# Patient Record
Sex: Male | Born: 1960 | Race: White | Hispanic: No | Marital: Single | Smoking: Never smoker
Health system: Southern US, Community
[De-identification: ages and names within clinical notes are randomized; demographics above are authoritative.]

## PROBLEM LIST (undated history)

## (undated) DIAGNOSIS — F0393 Unspecified dementia, unspecified severity, with mood disturbance: Secondary | ICD-10-CM

## (undated) DIAGNOSIS — F0281 Dementia in other diseases classified elsewhere with behavioral disturbance: Secondary | ICD-10-CM

## (undated) DIAGNOSIS — U071 COVID-19: Secondary | ICD-10-CM

## (undated) DIAGNOSIS — F339 Major depressive disorder, recurrent, unspecified: Secondary | ICD-10-CM

## (undated) DIAGNOSIS — I959 Hypotension, unspecified: Secondary | ICD-10-CM

## (undated) DIAGNOSIS — G20A1 Parkinson's disease without dyskinesia, without mention of fluctuations: Secondary | ICD-10-CM

## (undated) DIAGNOSIS — R41841 Cognitive communication deficit: Secondary | ICD-10-CM

## (undated) DIAGNOSIS — F329 Major depressive disorder, single episode, unspecified: Secondary | ICD-10-CM

## (undated) DIAGNOSIS — J9601 Acute respiratory failure with hypoxia: Secondary | ICD-10-CM

## (undated) DIAGNOSIS — F0280 Dementia in other diseases classified elsewhere without behavioral disturbance: Secondary | ICD-10-CM

## (undated) DIAGNOSIS — R262 Difficulty in walking, not elsewhere classified: Secondary | ICD-10-CM

## (undated) DIAGNOSIS — Z741 Need for assistance with personal care: Secondary | ICD-10-CM

## (undated) DIAGNOSIS — F02818 Dementia in other diseases classified elsewhere, unspecified severity, with other behavioral disturbance: Secondary | ICD-10-CM

## (undated) DIAGNOSIS — G4089 Other seizures: Secondary | ICD-10-CM

## (undated) DIAGNOSIS — G20 Parkinson's disease: Secondary | ICD-10-CM

## (undated) DIAGNOSIS — G301 Alzheimer's disease with late onset: Secondary | ICD-10-CM

## (undated) DIAGNOSIS — F0391 Unspecified dementia with behavioral disturbance: Secondary | ICD-10-CM

## (undated) DIAGNOSIS — G4700 Insomnia, unspecified: Secondary | ICD-10-CM

## (undated) DIAGNOSIS — F32A Depression, unspecified: Secondary | ICD-10-CM

## (undated) DIAGNOSIS — K5900 Constipation, unspecified: Secondary | ICD-10-CM

## (undated) DIAGNOSIS — G9341 Metabolic encephalopathy: Secondary | ICD-10-CM

## (undated) DIAGNOSIS — F0390 Unspecified dementia without behavioral disturbance: Secondary | ICD-10-CM

## (undated) DIAGNOSIS — R488 Other symbolic dysfunctions: Secondary | ICD-10-CM

## (undated) DIAGNOSIS — F03918 Unspecified dementia, unspecified severity, with other behavioral disturbance: Secondary | ICD-10-CM

## (undated) DIAGNOSIS — M6281 Muscle weakness (generalized): Secondary | ICD-10-CM

## (undated) DIAGNOSIS — R296 Repeated falls: Secondary | ICD-10-CM

## (undated) DIAGNOSIS — R569 Unspecified convulsions: Secondary | ICD-10-CM

---
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
## 2019-03-08 ENCOUNTER — Other Ambulatory Visit: Payer: Self-pay

## 2019-03-08 ENCOUNTER — Emergency Department (HOSPITAL_COMMUNITY): Payer: Medicare Other

## 2019-03-08 ENCOUNTER — Emergency Department (HOSPITAL_COMMUNITY)
Admission: EM | Admit: 2019-03-08 | Discharge: 2019-03-09 | Disposition: A | Discharge status: Home / Self Care | Payer: Medicare Other | Attending: Emergency Medicine | Admitting: Emergency Medicine

## 2019-03-08 ENCOUNTER — Encounter (HOSPITAL_COMMUNITY): Payer: Self-pay | Admitting: *Deleted

## 2019-03-08 DIAGNOSIS — G3183 Dementia with Lewy bodies: Secondary | ICD-10-CM | POA: Insufficient documentation

## 2019-03-08 DIAGNOSIS — Z79899 Other long term (current) drug therapy: Secondary | ICD-10-CM | POA: Insufficient documentation

## 2019-03-08 DIAGNOSIS — F0150 Vascular dementia without behavioral disturbance: Secondary | ICD-10-CM | POA: Diagnosis not present

## 2019-03-08 DIAGNOSIS — G301 Alzheimer's disease with late onset: Secondary | ICD-10-CM | POA: Diagnosis not present

## 2019-03-08 DIAGNOSIS — R4182 Altered mental status, unspecified: Secondary | ICD-10-CM | POA: Diagnosis present

## 2019-03-08 DIAGNOSIS — E860 Dehydration: Secondary | ICD-10-CM | POA: Insufficient documentation

## 2019-03-08 HISTORY — DX: Depression, unspecified: F32.A

## 2019-03-08 HISTORY — DX: Other seizures: G40.89

## 2019-03-08 HISTORY — DX: Unspecified dementia with behavioral disturbance: F03.91

## 2019-03-08 HISTORY — DX: Unspecified dementia without behavioral disturbance: F03.90

## 2019-03-08 HISTORY — DX: Difficulty in walking, not elsewhere classified: R26.2

## 2019-03-08 HISTORY — DX: Dementia in other diseases classified elsewhere with behavioral disturbance: F02.81

## 2019-03-08 HISTORY — DX: Other symbolic dysfunctions: R48.8

## 2019-03-08 HISTORY — DX: Metabolic encephalopathy: G93.41

## 2019-03-08 HISTORY — DX: Need for assistance with personal care: Z74.1

## 2019-03-08 HISTORY — DX: Dementia in other diseases classified elsewhere, unspecified severity, without behavioral disturbance, psychotic disturbance, mood disturbance, and anxiety: F02.80

## 2019-03-08 HISTORY — DX: Major depressive disorder, recurrent, unspecified: F33.9

## 2019-03-08 HISTORY — DX: Alzheimer's disease with late onset: G30.1

## 2019-03-08 HISTORY — DX: Unspecified dementia, unspecified severity, with other behavioral disturbance: F03.918

## 2019-03-08 HISTORY — DX: Parkinson's disease: G20

## 2019-03-08 HISTORY — DX: Insomnia, unspecified: G47.00

## 2019-03-08 HISTORY — DX: Dementia in other diseases classified elsewhere, unspecified severity, with other behavioral disturbance: F02.818

## 2019-03-08 HISTORY — DX: COVID-19: U07.1

## 2019-03-08 HISTORY — DX: Acute respiratory failure with hypoxia: J96.01

## 2019-03-08 HISTORY — DX: Parkinson's disease without dyskinesia, without mention of fluctuations: G20.A1

## 2019-03-08 HISTORY — DX: Muscle weakness (generalized): M62.81

## 2019-03-08 HISTORY — DX: Unspecified convulsions: R56.9

## 2019-03-08 HISTORY — DX: Cognitive communication deficit: R41.841

## 2019-03-08 HISTORY — DX: Repeated falls: R29.6

## 2019-03-08 HISTORY — DX: Constipation, unspecified: K59.00

## 2019-03-08 HISTORY — DX: Hypotension, unspecified: I95.9

## 2019-03-08 HISTORY — DX: Unspecified dementia, unspecified severity, with mood disturbance: F03.93

## 2019-03-08 LAB — COMPREHENSIVE METABOLIC PANEL
ALT: 49 U/L — ABNORMAL HIGH (ref 0–44)
AST: 57 U/L — ABNORMAL HIGH (ref 15–41)
Albumin: 3.3 g/dL — ABNORMAL LOW (ref 3.5–5.0)
Alkaline Phosphatase: 32 U/L — ABNORMAL LOW (ref 38–126)
Anion gap: 11 (ref 5–15)
BUN: 23 mg/dL — ABNORMAL HIGH (ref 6–20)
CO2: 27 mmol/L (ref 22–32)
Calcium: 8.6 mg/dL — ABNORMAL LOW (ref 8.9–10.3)
Chloride: 103 mmol/L (ref 98–111)
Creatinine, Ser: 1.03 mg/dL (ref 0.61–1.24)
GFR calc Af Amer: >60 mL/min (ref >60)
GFR calc non Af Amer: >60 mL/min (ref >60)
Glucose, Bld: 119 mg/dL — ABNORMAL HIGH (ref 70–99)
Potassium: 4.2 mmol/L (ref 3.5–5.1)
Sodium: 141 mmol/L (ref 135–145)
Total Bilirubin: 0.8 mg/dL (ref 0.3–1.2)
Total Protein: 6.8 g/dL (ref 6.5–8.1)

## 2019-03-08 LAB — CBC WITH DIFFERENTIAL/PLATELET
Abs Immature Granulocytes: 0.02 10*3/uL (ref 0.00–0.07)
Basophils Absolute: 0.1 10*3/uL (ref 0.0–0.1)
Basophils Relative: 1 %
Eosinophils Absolute: 0 10*3/uL (ref 0.0–0.5)
Eosinophils Relative: 0 %
HCT: 32 % — ABNORMAL LOW (ref 39.0–52.0)
Hemoglobin: 10 g/dL — ABNORMAL LOW (ref 13.0–17.0)
Immature Granulocytes: 0 %
Lymphocytes Relative: 11 %
Lymphs Abs: 0.9 10*3/uL (ref 0.7–4.0)
MCH: 31.3 pg (ref 26.0–34.0)
MCHC: 31.3 g/dL (ref 30.0–36.0)
MCV: 100 fL (ref 80.0–100.0)
Monocytes Absolute: 0.9 10*3/uL (ref 0.1–1.0)
Monocytes Relative: 11 %
Neutro Abs: 5.8 10*3/uL (ref 1.7–7.7)
Neutrophils Relative %: 77 %
Platelets: 167 10*3/uL (ref 150–400)
RBC: 3.2 MIL/uL — ABNORMAL LOW (ref 4.22–5.81)
RDW: 13 % (ref 11.5–15.5)
WBC: 7.6 10*3/uL (ref 4.0–10.5)
nRBC: 0 % (ref 0.0–0.2)

## 2019-03-08 MED ORDER — SODIUM CHLORIDE 0.9 % IV BOLUS
1000.0000 mL | Freq: Once | INTRAVENOUS | Status: AC
  Administered 2019-03-08: 23:00:00 1000 mL via INTRAVENOUS

## 2019-03-08 NOTE — Discharge Instructions (Addendum)
Drink more fluids and follow-up with your family doctor this week

## 2019-03-08 NOTE — ED Triage Notes (Signed)
Pt brought in by [facility] for c/o altered loc and weakness; pt is non-verbal but staff reports pt can eat with assistance and pt has not been eating for the last 3-4 days; pt tested positive for covid January 19th; pt screams out with movement

## 2019-03-09 NOTE — ED Provider Notes (Signed)
[facility] EMERGENCY DEPARTMENT Provider Note   CSN: [id number] Arrival date & time: 03/08/19  1938     History Chief Complaint  Patient presents with  . Altered Mental Status    Ian Travis. is a 59 y.o. male.  Patient stated nursing home.  He is nonverbal normally.  Nursing home staff says he is eating and drinking less.  The history is provided by a caregiver.  Altered Mental Status Presenting symptoms: behavior changes   Severity:  Mild Most recent episode:  Today Episode history:  Multiple Timing:  Constant Progression:  Worsening Chronicity:  New      Past Medical History:  Diagnosis Date  . Acute respiratory failure with hypoxia (HCC)   . Aphasia-angular gyrus syndrome   . Cannot walk   . CN (constipation)   . Cognitive communication deficit   . Dementia of the Alzheimer's type, with late onset, with delusions (HCC)   . Dementia with behavioral disturbance (HCC)   . Dementia with Lewy bodies (HCC)   . Generalized-onset seizures (HCC)   . Hypotension, unspecified   . Metabolic encephalopathy   . Muscle weakness (generalized)   . Parkinsons disease (HCC)   . Persistent disorder of initiating or maintaining sleep   . Personal care impairment   . Pneumonia due to 2019-nCoV   . Presenile dementia with depressive features (HCC)   . Recurrent major depression (HCC)   . Repeated falls   . Somatosensory attacks (HCC)     There are no problems to display for this patient.   History reviewed. No pertinent surgical history.     History reviewed. No pertinent family history.  Social History   Tobacco Use  . Smoking status: Not on file  . Smokeless tobacco: Never Used  Substance Use Topics  . Alcohol use: Not Currently  . Drug use: Not Currently    Home Medications Prior to Admission medications   Medication Sig Start Date End Date Taking? Authorizing Provider  Amino Acids-Protein Hydrolys (PRO-STAT PO) Take 30 mLs by mouth 2 (two) times  daily.   Yes [provider]  ascorbic acid (VITAMIN C) 500 MG tablet Take 500 mg by mouth 2 (two) times daily.   Yes [provider]  fludrocortisone (FLORINEF) 0.1 MG tablet Take 0.1 mg by mouth daily.   Yes [provider]  midodrine (PROAMATINE) 10 MG tablet Take 10 mg by mouth 3 (three) times daily.   Yes [provider]  Multiple Vitamin (MULTIVITAMIN WITH MINERALS) TABS tablet Take 1 tablet by mouth daily.   Yes [provider]  risperiDONE (RISPERDAL) 0.5 MG tablet Take 0.5 mg by mouth 2 (two) times daily.   Yes [provider]  sertraline (ZOLOFT) 100 MG tablet Take 100 mg by mouth daily.   Yes [provider]  Valproate Sodium (DEPAKENE) 250 MG/5ML SOLN solution Take 500 mg by mouth 2 (two) times daily.   Yes [provider]    Allergies    Patient has no known allergies.  Review of Systems   Review of Systems  Unable to perform ROS: Mental status change    Physical Exam Updated Vital Signs BP 130/66   Pulse 95   Temp 98.7 F (37.1 C) (Oral)   Resp 15   SpO2 99%   Physical Exam Vitals and nursing note reviewed.  Constitutional:      Appearance: He is well-developed.  HENT:     Head: Normocephalic.     Mouth/Throat:  Comments: Right mucous membranes. Eyes:     General: No scleral icterus.    Conjunctiva/sclera: Conjunctivae normal.  Neck:     Thyroid: No thyromegaly.  Cardiovascular:     Rate and Rhythm: Normal rate and regular rhythm.     Heart sounds: No murmur. No friction rub. No gallop.   Pulmonary:     Breath sounds: No stridor. No wheezing or rales.  Chest:     Chest wall: No tenderness.  Abdominal:     General: There is no distension.     Tenderness: There is no abdominal tenderness. There is no rebound.  Musculoskeletal:        General: Normal range of motion.     Cervical back: Neck supple.  Lymphadenopathy:     Cervical: No cervical adenopathy.  Skin:    Findings: No  erythema or rash.  Neurological:     Mental Status: He is alert.     Motor: No abnormal muscle tone.     Coordination: Coordination normal.     Comments: Patient is nonverbal     ED Results / Procedures / Treatments   Labs (all labs ordered are listed, but only abnormal results are displayed) Labs Reviewed  COMPREHENSIVE METABOLIC PANEL - Abnormal; Notable for the following components:      Result Value   Glucose, Bld 119 (*)    BUN 23 (*)    Calcium 8.6 (*)    Albumin 3.3 (*)    AST 57 (*)    ALT 49 (*)    Alkaline Phosphatase 32 (*)    All other components within normal limits  CBC WITH DIFFERENTIAL/PLATELET - Abnormal; Notable for the following components:   RBC 3.20 (*)    Hemoglobin 10.0 (*)    HCT 32.0 (*)    All other components within normal limits    EKG EKG Interpretation  Date/Time:  Monday March 08 2019 19:49:01 EST Ventricular Rate:  88 PR Interval:    QRS Duration: 79 QT Interval:  352 QTC Calculation: 426 R Axis:   69 Text Interpretation: Sinus rhythm No previous ECGs available Confirmed by Theotis Burrow [phone]) on 03/09/2019 9:19:43 AM   Radiology CT Head Wo Contrast  Result Date: 03/08/2019 CLINICAL DATA:  Ataxia, stroke suspected. Additional provided: Altered mental status, weakness, patient nonverbal at baseline but has not been eating for the past 3-4 days EXAM: CT HEAD WITHOUT CONTRAST TECHNIQUE: Contiguous axial images were obtained from the base of the skull through the vertex without intravenous contrast. COMPARISON:  Brain MRI 11/15/2015 FINDINGS: Brain: Streak artifact from dental restoration somewhat limits evaluation of the posterior fossa. No evidence of acute intracranial hemorrhage. No demarcated cortical infarction. No evidence of intracranial mass. No midline shift or extra-axial fluid collection. Moderate generalized cerebral atrophy has significantly progressed since MRI 11/19/2015 with associated interval increase in lateral and third  ventriculomegaly. Vascular: No hyperdense vessel. Skull: Normal. Negative for fracture or focal lesion. Sinuses/Orbits: Visualized orbits demonstrate no acute abnormality. Mild ethmoid sinus mucosal thickening. Small amount of frothy secretions within posterior right ethmoid air cells. Small bilateral maxillary sinus mucous retention cysts. No significant mastoid effusion. IMPRESSION: Streak artifact from dental restoration somewhat limits evaluation of the posterior fossa. Within this limitation, no evidence of acute intracranial abnormality. Moderate generalized cerebral atrophy has significantly progressed since MRI 11/19/2015. Paranasal sinus disease as described. This includes frothy secretions within posterior right ethmoid air cells. Correlate for acute on chronic sinusitis. Electronically Signed   By: Kellie Simmering  DO   On: 03/08/2019 21:59    Procedures Procedures (including critical care time)  Medications Ordered in ED Medications  sodium chloride 0.9 % bolus 1,000 mL (0 mLs Intravenous Stopped 03/09/19 0040)    ED Course  I have reviewed the triage vital signs and the nursing notes.  Pertinent labs & imaging results that were available during my care of the patient were reviewed by me and considered in my medical decision making (see chart for details).    MDM Rules/Calculators/A&P                      Labs suggest dehydration.  Patient given fluids and seemed to be back to his baseline.  He does have severe dementia.  He will be sent back to the nursing home Final Clinical Impression(s) / ED Diagnoses Final diagnoses:  Dehydration  Vascular dementia without behavioral disturbance [facility])    Rx / DC Orders ED Discharge Orders    None       Bethann Berkshire, MD 03/09/19 2308

## 2019-03-23 ENCOUNTER — Other Ambulatory Visit: Payer: Self-pay

## 2019-03-23 ENCOUNTER — Inpatient Hospital Stay (HOSPITAL_COMMUNITY)
Admission: EM | Admit: 2019-03-23 | Discharge: 2019-03-26 | DRG: 871 | Disposition: A | Discharge status: Skilled Nursing Facility | Payer: Medicare Other | Attending: Internal Medicine | Admitting: Internal Medicine

## 2019-03-23 ENCOUNTER — Encounter (HOSPITAL_COMMUNITY): Payer: Self-pay | Admitting: Emergency Medicine

## 2019-03-23 ENCOUNTER — Emergency Department (HOSPITAL_COMMUNITY): Payer: Medicare Other

## 2019-03-23 DIAGNOSIS — Z515 Encounter for palliative care: Secondary | ICD-10-CM

## 2019-03-23 DIAGNOSIS — Z681 Body mass index (BMI) 19 or less, adult: Secondary | ICD-10-CM | POA: Diagnosis not present

## 2019-03-23 DIAGNOSIS — F0280 Dementia in other diseases classified elsewhere without behavioral disturbance: Secondary | ICD-10-CM | POA: Diagnosis present

## 2019-03-23 DIAGNOSIS — G301 Alzheimer's disease with late onset: Secondary | ICD-10-CM | POA: Diagnosis present

## 2019-03-23 DIAGNOSIS — I9589 Other hypotension: Secondary | ICD-10-CM | POA: Diagnosis present

## 2019-03-23 DIAGNOSIS — G9341 Metabolic encephalopathy: Secondary | ICD-10-CM | POA: Diagnosis present

## 2019-03-23 DIAGNOSIS — R000 Tachycardia, unspecified: Secondary | ICD-10-CM | POA: Diagnosis present

## 2019-03-23 DIAGNOSIS — J9601 Acute respiratory failure with hypoxia: Secondary | ICD-10-CM | POA: Diagnosis present

## 2019-03-23 DIAGNOSIS — Z8616 Personal history of COVID-19: Secondary | ICD-10-CM | POA: Diagnosis present

## 2019-03-23 DIAGNOSIS — R569 Unspecified convulsions: Secondary | ICD-10-CM | POA: Diagnosis not present

## 2019-03-23 DIAGNOSIS — G40909 Epilepsy, unspecified, not intractable, without status epilepticus: Secondary | ICD-10-CM | POA: Diagnosis present

## 2019-03-23 DIAGNOSIS — Y95 Nosocomial condition: Secondary | ICD-10-CM | POA: Diagnosis present

## 2019-03-23 DIAGNOSIS — I959 Hypotension, unspecified: Secondary | ICD-10-CM | POA: Diagnosis present

## 2019-03-23 DIAGNOSIS — L89102 Pressure ulcer of unspecified part of back, stage 2: Secondary | ICD-10-CM | POA: Diagnosis present

## 2019-03-23 DIAGNOSIS — K5900 Constipation, unspecified: Secondary | ICD-10-CM | POA: Diagnosis present

## 2019-03-23 DIAGNOSIS — E860 Dehydration: Secondary | ICD-10-CM | POA: Diagnosis present

## 2019-03-23 DIAGNOSIS — R627 Adult failure to thrive: Secondary | ICD-10-CM | POA: Diagnosis present

## 2019-03-23 DIAGNOSIS — G3183 Dementia with Lewy bodies: Secondary | ICD-10-CM | POA: Diagnosis present

## 2019-03-23 DIAGNOSIS — N179 Acute kidney failure, unspecified: Secondary | ICD-10-CM | POA: Diagnosis present

## 2019-03-23 DIAGNOSIS — J189 Pneumonia, unspecified organism: Secondary | ICD-10-CM | POA: Diagnosis present

## 2019-03-23 DIAGNOSIS — A419 Sepsis, unspecified organism: Principal | ICD-10-CM | POA: Diagnosis present

## 2019-03-23 DIAGNOSIS — R262 Difficulty in walking, not elsewhere classified: Secondary | ICD-10-CM | POA: Diagnosis present

## 2019-03-23 DIAGNOSIS — E872 Acidosis, unspecified: Secondary | ICD-10-CM | POA: Diagnosis present

## 2019-03-23 DIAGNOSIS — Z7401 Bed confinement status: Secondary | ICD-10-CM

## 2019-03-23 DIAGNOSIS — R488 Other symbolic dysfunctions: Secondary | ICD-10-CM | POA: Diagnosis not present

## 2019-03-23 DIAGNOSIS — F0281 Dementia in other diseases classified elsewhere with behavioral disturbance: Secondary | ICD-10-CM | POA: Diagnosis present

## 2019-03-23 DIAGNOSIS — Z8701 Personal history of pneumonia (recurrent): Secondary | ICD-10-CM

## 2019-03-23 DIAGNOSIS — G20A1 Parkinson's disease without dyskinesia, without mention of fluctuations: Secondary | ICD-10-CM | POA: Diagnosis present

## 2019-03-23 DIAGNOSIS — R41841 Cognitive communication deficit: Secondary | ICD-10-CM

## 2019-03-23 DIAGNOSIS — R1310 Dysphagia, unspecified: Secondary | ICD-10-CM | POA: Diagnosis present

## 2019-03-23 DIAGNOSIS — E46 Unspecified protein-calorie malnutrition: Secondary | ICD-10-CM | POA: Diagnosis present

## 2019-03-23 DIAGNOSIS — R6520 Severe sepsis without septic shock: Secondary | ICD-10-CM | POA: Diagnosis not present

## 2019-03-23 DIAGNOSIS — Z7189 Other specified counseling: Secondary | ICD-10-CM | POA: Diagnosis not present

## 2019-03-23 DIAGNOSIS — R4701 Aphasia: Secondary | ICD-10-CM | POA: Diagnosis present

## 2019-03-23 DIAGNOSIS — Z66 Do not resuscitate: Secondary | ICD-10-CM | POA: Diagnosis present

## 2019-03-23 DIAGNOSIS — D72829 Elevated white blood cell count, unspecified: Secondary | ICD-10-CM | POA: Diagnosis present

## 2019-03-23 DIAGNOSIS — G20 Parkinson's disease: Secondary | ICD-10-CM | POA: Diagnosis present

## 2019-03-23 DIAGNOSIS — Z79899 Other long term (current) drug therapy: Secondary | ICD-10-CM

## 2019-03-23 LAB — CBC WITH DIFFERENTIAL/PLATELET
Abs Immature Granulocytes: 0.09 10*3/uL — ABNORMAL HIGH (ref 0.00–0.07)
Basophils Absolute: 0.1 10*3/uL (ref 0.0–0.1)
Basophils Relative: 1 %
Eosinophils Absolute: 0 10*3/uL (ref 0.0–0.5)
Eosinophils Relative: 0 %
HCT: 38.1 % — ABNORMAL LOW (ref 39.0–52.0)
Hemoglobin: 11.2 g/dL — ABNORMAL LOW (ref 13.0–17.0)
Immature Granulocytes: 1 %
Lymphocytes Relative: 3 %
Lymphs Abs: 0.5 10*3/uL — ABNORMAL LOW (ref 0.7–4.0)
MCH: 30.4 pg (ref 26.0–34.0)
MCHC: 29.4 g/dL — ABNORMAL LOW (ref 30.0–36.0)
MCV: 103.5 fL — ABNORMAL HIGH (ref 80.0–100.0)
Monocytes Absolute: 2 10*3/uL — ABNORMAL HIGH (ref 0.1–1.0)
Monocytes Relative: 13 %
Neutro Abs: 12.6 10*3/uL — ABNORMAL HIGH (ref 1.7–7.7)
Neutrophils Relative %: 82 %
Platelets: 310 10*3/uL (ref 150–400)
RBC: 3.68 MIL/uL — ABNORMAL LOW (ref 4.22–5.81)
RDW: 13.2 % (ref 11.5–15.5)
WBC: 15.3 10*3/uL — ABNORMAL HIGH (ref 4.0–10.5)
nRBC: 0 % (ref 0.0–0.2)

## 2019-03-23 LAB — URINALYSIS, ROUTINE W REFLEX MICROSCOPIC
Bilirubin Urine: NEGATIVE
Glucose, UA: NEGATIVE mg/dL
Hgb urine dipstick: NEGATIVE
Ketones, ur: NEGATIVE mg/dL
Leukocytes,Ua: NEGATIVE
Nitrite: NEGATIVE
Protein, ur: NEGATIVE mg/dL
Specific Gravity, Urine: 1.019 (ref 1.005–1.030)
pH: 6 (ref 5.0–8.0)

## 2019-03-23 LAB — COMPREHENSIVE METABOLIC PANEL
ALT: 36 U/L (ref 0–44)
AST: 44 U/L — ABNORMAL HIGH (ref 15–41)
Albumin: 2.4 g/dL — ABNORMAL LOW (ref 3.5–5.0)
Alkaline Phosphatase: 35 U/L — ABNORMAL LOW (ref 38–126)
Anion gap: 14 (ref 5–15)
BUN: 39 mg/dL — ABNORMAL HIGH (ref 6–20)
CO2: 22 mmol/L (ref 22–32)
Calcium: 8 mg/dL — ABNORMAL LOW (ref 8.9–10.3)
Chloride: 109 mmol/L (ref 98–111)
Creatinine, Ser: 1.74 mg/dL — ABNORMAL HIGH (ref 0.61–1.24)
GFR calc Af Amer: 49 mL/min — ABNORMAL LOW (ref >60)
GFR calc non Af Amer: 42 mL/min — ABNORMAL LOW (ref >60)
Glucose, Bld: 104 mg/dL — ABNORMAL HIGH (ref 70–99)
Potassium: 4.9 mmol/L (ref 3.5–5.1)
Sodium: 145 mmol/L (ref 135–145)
Total Bilirubin: 0.8 mg/dL (ref 0.3–1.2)
Total Protein: 6.2 g/dL — ABNORMAL LOW (ref 6.5–8.1)

## 2019-03-23 LAB — RESPIRATORY PANEL BY RT PCR (FLU A&B, COVID)
Influenza A by PCR: NEGATIVE
Influenza B by PCR: NEGATIVE
SARS Coronavirus 2 by RT PCR: NEGATIVE

## 2019-03-23 LAB — LACTIC ACID, PLASMA
Lactic Acid, Venous: 6 mmol/L (ref 0.5–1.9)
Lactic Acid, Venous: 4.4 mmol/L (ref 0.5–1.9)

## 2019-03-23 LAB — APTT: aPTT: 41 seconds — ABNORMAL HIGH (ref 24–36)

## 2019-03-23 LAB — PROTIME-INR
INR: 1.4 — ABNORMAL HIGH (ref 0.8–1.2)
Prothrombin Time: 16.9 seconds — ABNORMAL HIGH (ref 11.4–15.2)

## 2019-03-23 MED ORDER — IPRATROPIUM-ALBUTEROL 0.5-2.5 (3) MG/3ML IN SOLN
3.0000 mL | Freq: Three times a day (TID) | RESPIRATORY_TRACT | Status: AC
  Administered 2019-03-23 – 2019-03-25 (×5): 3 mL via RESPIRATORY_TRACT
  Filled 2019-03-23 (×4): qty 3

## 2019-03-23 MED ORDER — METRONIDAZOLE IN NACL 5-0.79 MG/ML-% IV SOLN
500.0000 mg | Freq: Three times a day (TID) | INTRAVENOUS | Status: DC
  Administered 2019-03-23 – 2019-03-26 (×9): 500 mg via INTRAVENOUS
  Filled 2019-03-23 (×9): qty 100

## 2019-03-23 MED ORDER — VALPROATE SODIUM 250 MG/5ML PO SOLN
ORAL | Status: AC
  Filled 2019-03-23: qty 10

## 2019-03-23 MED ORDER — ASCORBIC ACID 500 MG PO TABS
500.0000 mg | ORAL_TABLET | Freq: Two times a day (BID) | ORAL | Status: DC
  Administered 2019-03-23 – 2019-03-26 (×6): 500 mg via ORAL
  Filled 2019-03-23 (×6): qty 1

## 2019-03-23 MED ORDER — SODIUM CHLORIDE 0.9 % IV SOLN
INTRAVENOUS | Status: DC
  Administered 2019-03-24: 02:00:00 60 mL/h via INTRAVENOUS

## 2019-03-23 MED ORDER — VALPROATE SODIUM 250 MG/5ML PO SOLN
500.0000 mg | Freq: Two times a day (BID) | ORAL | Status: DC
  Administered 2019-03-23 – 2019-03-26 (×5): 500 mg via ORAL
  Filled 2019-03-23 (×12): qty 10

## 2019-03-23 MED ORDER — ONDANSETRON HCL 4 MG/2ML IJ SOLN: 4.0000 mg | Freq: Four times a day (QID) | INTRAMUSCULAR | Status: DC | PRN

## 2019-03-23 MED ORDER — RISPERIDONE 1 MG PO TABS
0.5000 mg | ORAL_TABLET | Freq: Two times a day (BID) | ORAL | Status: DC
  Administered 2019-03-23 – 2019-03-26 (×6): 0.5 mg via ORAL
  Filled 2019-03-23 (×6): qty 1

## 2019-03-23 MED ORDER — LACTATED RINGERS IV BOLUS (SEPSIS)
500.0000 mL | Freq: Once | INTRAVENOUS | Status: AC
  Administered 2019-03-23: 12:00:00 500 mL via INTRAVENOUS

## 2019-03-23 MED ORDER — VANCOMYCIN HCL 500 MG/100ML IV SOLN
500.0000 mg | Freq: Two times a day (BID) | INTRAVENOUS | Status: DC
  Filled 2019-03-23 (×2): qty 100

## 2019-03-23 MED ORDER — HEPARIN SODIUM (PORCINE) 5000 UNIT/ML IJ SOLN
5000.0000 [IU] | Freq: Three times a day (TID) | INTRAMUSCULAR | Status: DC
  Administered 2019-03-23 – 2019-03-26 (×9): 5000 [IU] via SUBCUTANEOUS
  Filled 2019-03-23 (×9): qty 1

## 2019-03-23 MED ORDER — MIDODRINE HCL 5 MG PO TABS
10.0000 mg | ORAL_TABLET | Freq: Three times a day (TID) | ORAL | Status: DC
  Administered 2019-03-23 – 2019-03-26 (×7): 10 mg via ORAL
  Filled 2019-03-23 (×8): qty 2

## 2019-03-23 MED ORDER — ORAL CARE MOUTH RINSE
15.0000 mL | Freq: Two times a day (BID) | OROMUCOSAL | Status: DC
  Administered 2019-03-24 – 2019-03-26 (×5): 15 mL via OROMUCOSAL

## 2019-03-23 MED ORDER — PRO-STAT 64 PO LIQD
30.0000 mL | Freq: Two times a day (BID) | ORAL | Status: DC
  Administered 2019-03-23 – 2019-03-26 (×6): 30 mL via ORAL
  Filled 2019-03-23 (×6): qty 30

## 2019-03-23 MED ORDER — ADULT MULTIVITAMIN W/MINERALS CH
1.0000 | ORAL_TABLET | Freq: Every day | ORAL | Status: DC
  Administered 2019-03-23 – 2019-03-26 (×4): 1 via ORAL
  Filled 2019-03-23 (×4): qty 1

## 2019-03-23 MED ORDER — CHLORHEXIDINE GLUCONATE 0.12 % MT SOLN
15.0000 mL | Freq: Two times a day (BID) | OROMUCOSAL | Status: DC
  Administered 2019-03-23 – 2019-03-26 (×6): 15 mL via OROMUCOSAL
  Filled 2019-03-23 (×5): qty 15

## 2019-03-23 MED ORDER — SODIUM CHLORIDE 0.9 % IV SOLN
2.0000 g | Freq: Two times a day (BID) | INTRAVENOUS | Status: DC
  Administered 2019-03-24 (×3): 2 g via INTRAVENOUS
  Filled 2019-03-23 (×3): qty 2

## 2019-03-23 MED ORDER — ONDANSETRON HCL 4 MG PO TABS: 4.0000 mg | ORAL_TABLET | Freq: Four times a day (QID) | ORAL | Status: DC | PRN

## 2019-03-23 MED ORDER — SERTRALINE HCL 50 MG PO TABS
100.0000 mg | ORAL_TABLET | Freq: Every day | ORAL | Status: DC
  Administered 2019-03-23 – 2019-03-26 (×4): 100 mg via ORAL
  Filled 2019-03-23 (×4): qty 2

## 2019-03-23 MED ORDER — METRONIDAZOLE IN NACL 5-0.79 MG/ML-% IV SOLN
500.0000 mg | Freq: Once | INTRAVENOUS | Status: AC
  Administered 2019-03-23: 12:00:00 500 mg via INTRAVENOUS
  Filled 2019-03-23: qty 100

## 2019-03-23 MED ORDER — SODIUM CHLORIDE 0.9 % IV SOLN: 2.0000 g | Freq: Three times a day (TID) | INTRAVENOUS | Status: DC

## 2019-03-23 MED ORDER — VANCOMYCIN HCL IN DEXTROSE 1-5 GM/200ML-% IV SOLN
1000.0000 mg | Freq: Once | INTRAVENOUS | Status: AC
  Administered 2019-03-23: 12:00:00 1000 mg via INTRAVENOUS
  Filled 2019-03-23: qty 200

## 2019-03-23 MED ORDER — LACTATED RINGERS IV BOLUS (SEPSIS)
250.0000 mL | Freq: Once | INTRAVENOUS | Status: AC
  Administered 2019-03-23: 12:00:00 250 mL via INTRAVENOUS

## 2019-03-23 MED ORDER — LACTATED RINGERS IV BOLUS
1000.0000 mL | Freq: Once | INTRAVENOUS | Status: AC
  Administered 2019-03-23: 16:00:00 1000 mL via INTRAVENOUS

## 2019-03-23 MED ORDER — ACETAMINOPHEN 650 MG RE SUPP
650.0000 mg | Freq: Four times a day (QID) | RECTAL | Status: DC | PRN
  Administered 2019-03-25: 13:00:00 650 mg via RECTAL
  Filled 2019-03-23: qty 1

## 2019-03-23 MED ORDER — FLUDROCORTISONE ACETATE 0.1 MG PO TABS
0.1000 mg | ORAL_TABLET | Freq: Every day | ORAL | Status: DC
  Administered 2019-03-23 – 2019-03-26 (×4): 0.1 mg via ORAL
  Filled 2019-03-23 (×6): qty 1

## 2019-03-23 MED ORDER — VANCOMYCIN HCL 500 MG/100ML IV SOLN
500.0000 mg | INTRAVENOUS | Status: DC
  Filled 2019-03-23 (×2): qty 100

## 2019-03-23 MED ORDER — SODIUM CHLORIDE 0.9 % IV SOLN
2.0000 g | Freq: Once | INTRAVENOUS | Status: AC
  Administered 2019-03-23: 12:00:00 2 g via INTRAVENOUS
  Filled 2019-03-23: qty 2

## 2019-03-23 MED ORDER — LACTATED RINGERS IV BOLUS (SEPSIS)
1000.0000 mL | Freq: Once | INTRAVENOUS | Status: AC
  Administered 2019-03-23: 12:00:00 1000 mL via INTRAVENOUS

## 2019-03-23 MED ORDER — BISACODYL 5 MG PO TBEC: 5.0000 mg | DELAYED_RELEASE_TABLET | Freq: Every day | ORAL | Status: DC | PRN

## 2019-03-23 MED ORDER — ACETAMINOPHEN 325 MG PO TABS
650.0000 mg | ORAL_TABLET | Freq: Four times a day (QID) | ORAL | Status: DC | PRN
  Administered 2019-03-24 (×2): 650 mg via ORAL
  Filled 2019-03-23 (×2): qty 2

## 2019-03-23 NOTE — ED Notes (Addendum)
Unable to get all ordered bloodwork. Collected lactic acid, CMP, CBC, culture set. Started antibiotics to avoid delay in treatment. Lab notified to get remaining labwork.

## 2019-03-23 NOTE — ED Notes (Signed)
RN verified with April at [facility] in [place] that patient tested positive for Covid-19 on 02-16-19.   Requested copy of test result be faxed to ED and sent to floor with patient. Primary RN notified.

## 2019-03-23 NOTE — ED Notes (Signed)
Date and time results received: 03/23/19 12:25 PM  (use smartphrase ".now" to insert current time)  Test: lactic acid Critical Value: 6.0  Name of Provider Notified: Rancour MD  Orders Received? Or Actions Taken?: na

## 2019-03-23 NOTE — Plan of Care (Signed)

## 2019-03-23 NOTE — Progress Notes (Signed)
Voicemail left for legal guardian regarding patient's admission status. Left the ICU's number for her to call back regarding any questions.

## 2019-03-23 NOTE — ED Notes (Signed)
Date and time results received: 03/23/19 5:22 PM  (use smartphrase ".now" to insert current time)  Test: lactic acid  Critical Value: 4.4  Name of Provider Notified: johnson, md  Orders Received? Or Actions Taken?: na

## 2019-03-23 NOTE — ED Provider Notes (Signed)
[facility] EMERGENCY DEPARTMENT Provider Note   CSN: [id number] Arrival date & time: 03/23/19  1048     History Chief Complaint  Patient presents with  . Altered Mental Status    Joy Haegele. is a 59 y.o. male.  Level 5 caveat for altered mental status and nonverbal.  Patient from nursing facility with generalized weakness, altered mental status, fever and hypoxia.  Patient apparently bedbound and nonverbal at baseline.  Does have a history of dementia as well as Parkinson's disease.  Facility reports decreased mental status this morning as well as hypoxia to the 70s and tachycardic to 130s.  Initial blood pressure for EMS to 89 systolic.  Does have fever on arrival to 102.  Patient not able to give a history.  DNR in place.  Patient is a ward of the state.  Discussed with his legal guardian Roteakka Cuthrell.  She states he is a DNR/DNI.  She is agreeable to any other treatment necessary.  The history is provided by the patient and the EMS personnel. The history is limited by the condition of the patient.  Altered Mental Status      Past Medical History:  Diagnosis Date  . Acute respiratory failure with hypoxia (HCC)   . Aphasia-angular gyrus syndrome   . Cannot walk   . CN (constipation)   . Cognitive communication deficit   . Dementia of the Alzheimer's type, with late onset, with delusions (HCC)   . Dementia with behavioral disturbance (HCC)   . Dementia with Lewy bodies (HCC)   . Generalized-onset seizures (HCC)   . Hypotension, unspecified   . Metabolic encephalopathy   . Muscle weakness (generalized)   . Parkinsons disease (HCC)   . Persistent disorder of initiating or maintaining sleep   . Personal care impairment   . Pneumonia due to 2019-nCoV   . Presenile dementia with depressive features (HCC)   . Recurrent major depression (HCC)   . Repeated falls   . Somatosensory attacks (HCC)     There are no problems to display for this patient.   No past  surgical history on file.     No family history on file.  Social History   Tobacco Use  . Smoking status: Not on file  . Smokeless tobacco: Never Used  Substance Use Topics  . Alcohol use: Not Currently  . Drug use: Not Currently    Home Medications Prior to Admission medications   Medication Sig Start Date End Date Taking? Authorizing Provider  Amino Acids-Protein Hydrolys (PRO-STAT PO) Take 30 mLs by mouth 2 (two) times daily.    [provider]  ascorbic acid (VITAMIN C) 500 MG tablet Take 500 mg by mouth 2 (two) times daily.    [provider]  fludrocortisone (FLORINEF) 0.1 MG tablet Take 0.1 mg by mouth daily.    [provider]  midodrine (PROAMATINE) 10 MG tablet Take 10 mg by mouth 3 (three) times daily.    [provider]  Multiple Vitamin (MULTIVITAMIN WITH MINERALS) TABS tablet Take 1 tablet by mouth daily.    [provider]  risperiDONE (RISPERDAL) 0.5 MG tablet Take 0.5 mg by mouth 2 (two) times daily.    [provider]  sertraline (ZOLOFT) 100 MG tablet Take 100 mg by mouth daily.    [provider]  Valproate Sodium (DEPAKENE) 250 MG/5ML SOLN solution Take 500 mg by mouth 2 (two) times daily.    [provider]    Allergies  Patient has no known allergies.  Review of Systems   Review of Systems  Unable to perform ROS: Patient nonverbal    Physical Exam Updated Vital Signs BP 103/69 (BP Location: Left Arm)   Pulse (!) 105   Temp (!) 102 F (38.9 C) (Rectal)   Resp (!) 21   Ht 5\' 6"  (1.676 m)   Wt 53.5 kg   SpO2 94%   BMI 19.05 kg/m   Physical Exam Constitutional:      General: He is in acute distress.     Appearance: He is ill-appearing.     Comments: Cachectic, chronically ill-appearing, does not respond or speak  HENT:     Mouth/Throat:     Mouth: Mucous membranes are dry.  Cardiovascular:     Rate and Rhythm: Tachycardia present.  Pulmonary:     Comments: Breath  sounds normal on the right, diminished on left Abdominal:     Tenderness: There is no abdominal tenderness.  Genitourinary:    Comments: Stage II pressure ulcer to her low back Musculoskeletal:     Cervical back: Normal range of motion and neck supple.     Comments: Contractures of legs bilaterally  Skin:    General: Skin is dry.  Neurological:     Comments: Nonverbal, does not follow command     ED Results / Procedures / Treatments   Labs (all labs ordered are listed, but only abnormal results are displayed) Labs Reviewed  LACTIC ACID, PLASMA - Abnormal; Notable for the following components:      Result Value   Lactic Acid, Venous 6.0 (*)    All other components within normal limits  COMPREHENSIVE METABOLIC PANEL - Abnormal; Notable for the following components:   Glucose, Bld 104 (*)    BUN 39 (*)    Creatinine, Ser 1.74 (*)    Calcium 8.0 (*)    Total Protein 6.2 (*)    Albumin 2.4 (*)    AST 44 (*)    Alkaline Phosphatase 35 (*)    GFR calc non Af Amer 42 (*)    GFR calc Af Amer 49 (*)    All other components within normal limits  CBC WITH DIFFERENTIAL/PLATELET - Abnormal; Notable for the following components:   WBC 15.3 (*)    RBC 3.68 (*)    Hemoglobin 11.2 (*)    HCT 38.1 (*)    MCV 103.5 (*)    MCHC 29.4 (*)    Neutro Abs 12.6 (*)    Lymphs Abs 0.5 (*)    Monocytes Absolute 2.0 (*)    Abs Immature Granulocytes 0.09 (*)    All other components within normal limits  PROTIME-INR - Abnormal; Notable for the following components:   Prothrombin Time 16.9 (*)    INR 1.4 (*)    All other components within normal limits  APTT - Abnormal; Notable for the following components:   aPTT 41 (*)    All other components within normal limits  RESPIRATORY PANEL BY RT PCR (FLU A&B, COVID)  CULTURE, BLOOD (ROUTINE X 2)  CULTURE, BLOOD (ROUTINE X 2)  URINE CULTURE  MRSA PCR SCREENING  URINALYSIS, ROUTINE W REFLEX MICROSCOPIC  LACTIC ACID, PLASMA    EKG EKG  Interpretation  Date/Time:  Tuesday March 23 2019 11:10:14 EST Ventricular Rate:  101 PR Interval:    QRS Duration: 73 QT Interval:  422 QTC Calculation: 548 R Axis:   70 Text Interpretation: Sinus tachycardia Prolonged QT interval No significant change  was found Confirmed by Glynn Octave [phone]) on 03/23/2019 11:18:38 AM   Radiology DG Chest Port 1 View  Result Date: 03/23/2019 CLINICAL DATA:  Altered mental status.  Cough.  Fever. EXAM: PORTABLE CHEST 1 VIEW COMPARISON:  No prior. FINDINGS: Mediastinum and hilar structures normal. Heart size normal. Left base infiltrate. Small left pleural effusion. No pneumothorax. IMPRESSION: Left base infiltrate suggesting pneumonia. Associated small left pleural effusion. Electronically Signed   By: Maisie Fus  Register   On: 03/23/2019 11:20    Procedures .Critical Care Performed by: Glynn Octave, MD Authorized by: Glynn Octave, MD   Critical care provider statement:    Critical care time (minutes):  45   Critical care was necessary to treat or prevent imminent or life-threatening deterioration of the following conditions:  Sepsis and respiratory failure   Critical care was time spent personally by me on the following activities:  Discussions with consultants, evaluation of patient's response to treatment, examination of patient, ordering and performing treatments and interventions, ordering and review of laboratory studies, ordering and review of radiographic studies, pulse oximetry, re-evaluation of patient's condition, obtaining history from patient or surrogate and review of old charts   (including critical care time)  Medications Ordered in ED Medications  lactated ringers bolus 1,000 mL (has no administration in time range)    And  lactated ringers bolus 500 mL (has no administration in time range)    And  lactated ringers bolus 250 mL (has no administration in time range)  ceFEPIme (MAXIPIME) 2 g in sodium chloride 0.9 %  100 mL IVPB (has no administration in time range)  metroNIDAZOLE (FLAGYL) IVPB 500 mg (has no administration in time range)  vancomycin (VANCOCIN) IVPB 1000 mg/200 mL premix (has no administration in time range)    ED Course  I have reviewed the triage vital signs and the nursing notes.  Pertinent labs & imaging results that were available during my care of the patient were reviewed by me and considered in my medical decision making (see chart for details).    MDM Rules/Calculators/A&P                     Patient from facility with change in mental status.  He meets sepsis criteria on arrival with fever, tachycardia, hypotension and hypoxia.  Code sepsis activated.  Patient given broad-spectrum antibiotics after cultures obtained.  Started on fluid resuscitation.  Labs with leukocytosis, lactic acidosis. Infiltrates on CXR. Was apparently treated for COVID in early January. Test today is negative.   Consider aspiration. AKI on labs as well.  Continue IVF and IV antibiotics.  BP improving to 120 systolic.  Lactic acidosis in setting of sepsis and dehydration.   DNR/DNI confirmed with his legal guardian.  Weaned from NRB to nasal cannula. No significant increased work of breathing.   Admission d/w Dr. Laural Benes.  [name]. was evaluated in Emergency Department on 03/23/2019 for the symptoms described in the history of present illness. He was evaluated in the context of the global COVID-19 pandemic, which necessitated consideration that the patient might be at risk for infection with the SARS-CoV-2 virus that causes COVID-19. Institutional protocols and algorithms that pertain to the evaluation of patients at risk for COVID-19 are in a state of rapid change based on information released by regulatory bodies including the CDC and federal and state organizations. These policies and algorithms were followed during the patient's care in the ED.  Final Clinical Impression(s) / ED  Diagnoses  Final diagnoses:  Sepsis with encephalopathy without septic shock, due to unspecified organism [facility])    Rx / DC Orders ED Discharge Orders    None       Kaylin Schellenberg, Jeannett Senior, MD 03/23/19 1656

## 2019-03-23 NOTE — ED Triage Notes (Signed)
Per EMS called out for AMS. Patient from [facility] [place]. Patient is non-verbal but awake. History of parkinson's, dementia, encephalopathy, cognitive communication deficits.

## 2019-03-23 NOTE — Progress Notes (Addendum)
Pharmacy Antibiotic Note  Ian Travis. is a 59 y.o. male admitted on 03/23/2019 with unknown source of infection.  Pharmacy has been consulted for Vancomycin and Cefepime dosing.  Plan: Vancomycin 1000 mg IV x 1 dose. Vancomycin 500 mg  IV every 24 hours. Goal trough 15-20 mcg/mL. Cefepime 2000 mg IV every 12 hours. Monitor labs, c/s, and vanco level as indicated.  Height: 5\' 6"  (167.6 cm) Weight: 118 lb (53.5 kg) IBW/kg (Calculated) : 63.8  Temp (24hrs), Avg:102 F (38.9 C), Min:102 F (38.9 C), Max:102 F (38.9 C)  No results for input(s): WBC, CREATININE, LATICACIDVEN, VANCOTROUGH, VANCOPEAK, VANCORANDOM, GENTTROUGH, GENTPEAK, GENTRANDOM, TOBRATROUGH, TOBRAPEAK, TOBRARND, AMIKACINPEAK, AMIKACINTROU, AMIKACIN in the last 168 hours.  Estimated Creatinine Clearance: 59.2 mL/min (by C-G formula based on SCr of 1.03 mg/dL).    No Known Allergies  Antimicrobials this admission: Vanco 2/23 >>  Cefepime 2/23 >>  Flagyl 2/23 x 1 dose.  Dose adjustments this admission: Vanco  Microbiology results: 2/23 BCx: pending 2/23 UCx: pending    Thank you for allowing pharmacy to be a part of this patient's care.  3/23, PharmD Clinical Pharmacist 03/23/2019 12:10 PM

## 2019-03-23 NOTE — H&P (Addendum)
History and Physical  [facility]  [name]. AVW:[id number] DOB: 03-16-1960 DOA: 03/23/2019  PCP: Galvin Proffer, MD   Patient coming from: [facility] (long-term resident)  I have personally briefly reviewed patient's old medical records in [facility] Link  Chief Complaint: AMS   HPI: Ian Travis. is a 59 y.o. male who is a longterm care resident at [facility] was sent to ED by EMS from facility with reports that he has been increasingly somnolent and his behavior has become progressively nonresponsive.  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  He is a ward of the state and has a legal guardian.  I spoke with his legal guardian and she tells me that patient was treated for Covid pneumonia in [place] in early January 2021.  After recovery he has been at [facility].  He is on a pureed diet and requires assistance with feeding at baseline.  He is DNR/DNI.  He has history of chronic malnutrition and hypotension.  He has history of generalized seizures and is taking antiepileptics.  He is bedbound and has not been ambulatory in many years.   EMS found him hypoxic to 75% on RA and placed him on NRB and transported him to ED.   ED Course: Pt arrived listless, febrile up to 102, HR 105, BP 103/69, pulse ox 94% on NRB, creatinine 1.45, calcium 8.0, alb 2.4, lactic acid 6.0, WBC 15.3, Hg 11.2, MCV 103.5, platelet count 310.  EKG with sinus tachycardia, CXR with left base infiltrate, urinalysis normal, glucose 104.  Pt was started on sepsis bundle, blood and urine cultures taken, broad spectrum IV antibiotics started and admission was requested.    Review of Systems: UTO due to condition (pt obtunded, nonverbal)   Past Medical History:  Diagnosis Date  . Acute respiratory failure with hypoxia (HCC)   . Aphasia-angular gyrus syndrome   . Cannot walk     . CN (constipation)   . Cognitive communication deficit   . Dementia of the Alzheimer's type, with late onset, with delusions (HCC)   . Dementia with behavioral disturbance (HCC)   . Dementia with Lewy bodies (HCC)   . Generalized-onset seizures (HCC)   . Hypotension, unspecified   . Metabolic encephalopathy   . Muscle weakness (generalized)   . Parkinsons disease (HCC)   . Persistent disorder of initiating or maintaining sleep   . Personal care impairment   . Pneumonia due to 2019-nCoV   . Presenile dementia with depressive features (HCC)   . Recurrent major depression (HCC)   . Repeated falls   . Somatosensory attacks (HCC)     History reviewed. No pertinent surgical history.   reports that he has never smoked. He has never used smokeless tobacco. He reports previous alcohol use. He reports previous drug use.  No Known Allergies  History reviewed. No pertinent family history.   Prior to Admission medications   Medication Sig Start Date End Date Taking? Authorizing Provider  Amino Acids-Protein Hydrolys (PRO-STAT PO) Take 30 mLs by mouth 2 (two) times daily.    [provider]  ascorbic acid (VITAMIN C) 500 MG tablet Take 500 mg by mouth 2 (two) times daily.    [provider]  fludrocortisone (FLORINEF) 0.1 MG tablet Take 0.1 mg by mouth daily.    [provider]  midodrine (PROAMATINE) 10 MG  tablet Take 10 mg by mouth 3 (three) times daily.    [provider]  Multiple Vitamin (MULTIVITAMIN WITH MINERALS) TABS tablet Take 1 tablet by mouth daily.    [provider]  risperiDONE (RISPERDAL) 0.5 MG tablet Take 0.5 mg by mouth 2 (two) times daily.    [provider]  sertraline (ZOLOFT) 100 MG tablet Take 100 mg by mouth daily.    [provider]  Valproate Sodium (DEPAKENE) 250 MG/5ML SOLN solution Take 500 mg by mouth 2 (two) times daily.    [provider]    Physical Exam: Vitals:   03/23/19 1102  03/23/19 1105  BP: 103/69   Pulse: (!) 105   Resp: (!) 21   Temp: (!) 102 F (38.9 C)   TempSrc: Rectal   SpO2: 94%   Weight:  53.5 kg  Height:  5\' 6"  (1.676 m)   Constitutional: emaciated, nonverbal, listless, somnolent patient, eyes open but does not respond, appears ill, appears much older than stated age.  Eyes: PERRL, lids and severely dry mucus membranes. ENMT: Mucous membranes are dry.  Posterior pharynx clear of any exudate or lesions. Poor dentition.   Neck: normal, supple, no masses, no thyromegaly Respiratory: diminished breath sounds in LLL, no crackles. Mild tachypnea seen. No accessory muscle use.  Cardiovascular: tachycardic, no murmurs / rubs / gallops. No extremity edema. 2+ pedal pulses. No carotid bruits.  Abdomen: no tenderness, no masses palpated. No hepatosplenomegaly. Bowel sounds positive.  Musculoskeletal: no clubbing / cyanosis. LE contractures.  wasted muscle tone. Bilateral heel protectors in place.   Skin: no rashes, lesions, ulcers. No induration Psychiatric: obtunded state.   Labs on Admission: I have personally reviewed following labs and imaging studies  CBC: Recent Labs  Lab 03/23/19 1129  WBC 15.3*  NEUTROABS 12.6*  HGB 11.2*  HCT 38.1*  MCV 103.5*  PLT 310   Basic Metabolic Panel: Recent Labs  Lab 03/23/19 1129  NA 145  K 4.9  CL 109  CO2 22  GLUCOSE 104*  BUN 39*  CREATININE 1.74*  CALCIUM 8.0*   GFR: Estimated Creatinine Clearance: 35 mL/min (A) (by C-G formula based on SCr of 1.74 mg/dL (H)). Liver Function Tests: Recent Labs  Lab 03/23/19 1129  AST 44*  ALT 36  ALKPHOS 35*  BILITOT 0.8  PROT 6.2*  ALBUMIN 2.4*   No results for input(s): LIPASE, AMYLASE in the last 168 hours. No results for input(s): AMMONIA in the last 168 hours. Coagulation Profile: No results for input(s): INR, PROTIME in the last 168 hours. Cardiac Enzymes: No results for input(s): CKTOTAL, CKMB, CKMBINDEX, TROPONINI in the last 168  hours. BNP (last 3 results) No results for input(s): PROBNP in the last 8760 hours. HbA1C: No results for input(s): HGBA1C in the last 72 hours. CBG: No results for input(s): GLUCAP in the last 168 hours. Lipid Profile: No results for input(s): CHOL, HDL, LDLCALC, TRIG, CHOLHDL, LDLDIRECT in the last 72 hours. Thyroid Function Tests: No results for input(s): TSH, T4TOTAL, FREET4, T3FREE, THYROIDAB in the last 72 hours. Anemia Panel: No results for input(s): VITAMINB12, FOLATE, FERRITIN, TIBC, IRON, RETICCTPCT in the last 72 hours. Urine analysis:    Component Value Date/Time   COLORURINE YELLOW 03/23/2019 1057   APPEARANCEUR CLEAR 03/23/2019 1057   LABSPEC 1.019 03/23/2019 1057   PHURINE 6.0 03/23/2019 1057   GLUCOSEU NEGATIVE 03/23/2019 1057   HGBUR NEGATIVE 03/23/2019 1057   BILIRUBINUR NEGATIVE 03/23/2019 1057   KETONESUR NEGATIVE 03/23/2019 1057  PROTEINUR NEGATIVE 03/23/2019 1057   NITRITE NEGATIVE 03/23/2019 [place] 03/23/2019 1057    Radiological Exams on Admission: DG Chest Port 1 View  Result Date: 03/23/2019 CLINICAL DATA:  Altered mental status.  Cough.  Fever. EXAM: PORTABLE CHEST 1 VIEW COMPARISON:  No prior. FINDINGS: Mediastinum and hilar structures normal. Heart size normal. Left base infiltrate. Small left pleural effusion. No pneumothorax. IMPRESSION: Left base infiltrate suggesting pneumonia. Associated small left pleural effusion. Electronically Signed   By: Marcello Moores  Register   On: 03/23/2019 11:20    EKG: Independently reviewed. Sinus tachycardia   Assessment/Plan Principal Problem:   Severe sepsis (HCC) Active Problems:   Leukocytosis   Dementia with Lewy bodies (HCC)   Cognitive communication deficit   DNR (do not resuscitate)   HCAP (healthcare-associated pneumonia)   Hypotension, unspecified   Aphasia-angular gyrus syndrome   Cannot walk   Parkinsons disease ([place])   CN (constipation)   Generalized-onset seizures ([place])    AKI (acute kidney injury) ([place])   Severe dehydration   Sinus tachycardia   History of COVID-19   Lactic acidosis   1. Severe sepsis - secondary to nosocomial pneumonia - Admit to stepdown ICU. Continue sepsis orderset bundle.  Continue broad spectrum antibiotics.  Full body exam and skin exam did not reveal other sources of infection. Continue aggressive IV fluids.  Follow cultures.  Follow labs.  2. Leukocytosis - Follow CBC with differential daily as treatment progresses.  3. Severe dehydration - IV fluids ordered.   4. HCAP - IV antibiotics as ordered, supportive care as ordered.  5. Acute respiratory failure with hypoxia - secondary to HCAP, he has responded nicely to supplemental oxygen which is contined.  6. Epilepsy - resume home valproic acid solution 500 mg BID.  Seizure precautions.  7. Severe dementia - At baseline patient requires full support for all ADLs.   8. AKI - prerenal, treating with IV fluid hydration.  9. Sinus tachycardia - treating acute illness as above, hydrating, cardiac monitoring.  10. History of Covid 19 Pneumonia - I spoke with his legal guardian and he was treated for Covid pneumonia at a hospital in [place] in early January 2021, that being more than 21 days ago, would not place him on Covid isolation per health system protocol.  11. Lactic acidosis - markedly elevated lactic acid, in the setting of sepsis and severe dehydration would hydrate follow lactate and anticipate that it will trend down. I explained to sepsis RN that I do not feel PCCM consult is necessary at this time, but would re-evaluate if lactic acid not coming down of patient's condition deteriorates further after these aggressive treatments.   12. Chronic hypotension - home meds resumed, follow closely.  Has been exacerbated by sepsis but responding well to hydration. Follow.  13. Fever - antipyretics ordered as needed for comfort.   14. DNR present on admission - confirmed with legal guardian,  will honor DNR/DNI status while in hospital.   DVT prophylaxis: subcut heparin   Code Status: DNR confirmed with legal guardian   Family Communication: legal guardian updated by telephone   Disposition Plan: from longterm care [place], requires stepdown ICU level care, IV fluids, IV antibiotics, frequent clinical monitoring, he remains high risk for deterioration of condition and high mortality risk, anticipating hospital stay greater than 2 midnights  Consults called:   Admission status: INP   Critical Care Procedure Note Authorized and Performed by: Murvin Natal MD  Total Critical Care  time:  63 minutes  Due to a high probability of clinically significant, life threatening deterioration, the patient required my highest level of preparedness to intervene emergently and I personally spent this critical care time directly and personally managing the patient.  This critical care time included obtaining a history; examining the patient, pulse oximetry; ordering and review of studies; arranging urgent treatment with development of a management plan; evaluation of patient's response of treatment; frequent reassessment; and discussions with other providers.  This critical care time was performed to assess and manage the high probability of imminent and life threatening deterioration that could result in multi-organ failure.  It was exclusive of separately billable procedures and treating other patients and teaching time.    Standley Dakins MD [facility] How to contact the [facility] Attending or Consulting provider 7A - 7P or covering provider during after hours 7P -7A, for this patient?  1. Check the care team in [facility] and look for a) attending/consulting [facility] provider listed and b) the [facility] team listed 2. Log into [email or web address] and use [facility] universal password to access. If you do not have the password, please contact the hospital operator. 3. Locate the [facility] provider you are looking  for under [facility] and page to a number that you can be directly reached. 4. If you still have difficulty reaching the provider, please page the [facility] (Director on Call) for the Hospitalists listed on amion for assistance.   If 7PM-7AM, please contact night-coverage [email or web address] Password [facility]  03/23/2019, 2:11 PM

## 2019-03-24 DIAGNOSIS — Z515 Encounter for palliative care: Secondary | ICD-10-CM

## 2019-03-24 DIAGNOSIS — Z7189 Other specified counseling: Secondary | ICD-10-CM

## 2019-03-24 LAB — CBC WITH DIFFERENTIAL/PLATELET
Abs Immature Granulocytes: 0.05 10*3/uL (ref 0.00–0.07)
Basophils Absolute: 0.1 10*3/uL (ref 0.0–0.1)
Basophils Relative: 1 %
Eosinophils Absolute: 0 10*3/uL (ref 0.0–0.5)
Eosinophils Relative: 0 %
HCT: 32.1 % — ABNORMAL LOW (ref 39.0–52.0)
Hemoglobin: 9.2 g/dL — ABNORMAL LOW (ref 13.0–17.0)
Immature Granulocytes: 0 %
Lymphocytes Relative: 5 %
Lymphs Abs: 0.6 10*3/uL — ABNORMAL LOW (ref 0.7–4.0)
MCH: 30.5 pg (ref 26.0–34.0)
MCHC: 28.7 g/dL — ABNORMAL LOW (ref 30.0–36.0)
MCV: 106.3 fL — ABNORMAL HIGH (ref 80.0–100.0)
Monocytes Absolute: 1.7 10*3/uL — ABNORMAL HIGH (ref 0.1–1.0)
Monocytes Relative: 13 %
Neutro Abs: 10.3 10*3/uL — ABNORMAL HIGH (ref 1.7–7.7)
Neutrophils Relative %: 81 %
Platelets: 265 10*3/uL (ref 150–400)
RBC: 3.02 MIL/uL — ABNORMAL LOW (ref 4.22–5.81)
RDW: 13.2 % (ref 11.5–15.5)
WBC: 12.7 10*3/uL — ABNORMAL HIGH (ref 4.0–10.5)
nRBC: 0 % (ref 0.0–0.2)

## 2019-03-24 LAB — COMPREHENSIVE METABOLIC PANEL
ALT: 37 U/L (ref 0–44)
AST: 51 U/L — ABNORMAL HIGH (ref 15–41)
Albumin: 2.2 g/dL — ABNORMAL LOW (ref 3.5–5.0)
Alkaline Phosphatase: 34 U/L — ABNORMAL LOW (ref 38–126)
Anion gap: 9 (ref 5–15)
BUN: 30 mg/dL — ABNORMAL HIGH (ref 6–20)
CO2: 25 mmol/L (ref 22–32)
Calcium: 8.3 mg/dL — ABNORMAL LOW (ref 8.9–10.3)
Chloride: 111 mmol/L (ref 98–111)
Creatinine, Ser: 1.09 mg/dL (ref 0.61–1.24)
GFR calc Af Amer: >60 mL/min (ref >60)
GFR calc non Af Amer: >60 mL/min (ref >60)
Glucose, Bld: 111 mg/dL — ABNORMAL HIGH (ref 70–99)
Potassium: 4.4 mmol/L (ref 3.5–5.1)
Sodium: 145 mmol/L (ref 135–145)
Total Bilirubin: 0.4 mg/dL (ref 0.3–1.2)
Total Protein: 6.2 g/dL — ABNORMAL LOW (ref 6.5–8.1)

## 2019-03-24 LAB — URINE CULTURE: Culture: NO GROWTH

## 2019-03-24 LAB — HIV ANTIBODY (ROUTINE TESTING W REFLEX): HIV Screen 4th Generation wRfx: NONREACTIVE

## 2019-03-24 LAB — MAGNESIUM: Magnesium: 2.1 mg/dL (ref 1.7–2.4)

## 2019-03-24 LAB — LACTIC ACID, PLASMA: Lactic Acid, Venous: 4.1 mmol/L (ref 0.5–1.9)

## 2019-03-24 LAB — MRSA PCR SCREENING: MRSA by PCR: NEGATIVE

## 2019-03-24 LAB — PROCALCITONIN: Procalcitonin: 5.25 ng/mL

## 2019-03-24 MED ORDER — CHLORHEXIDINE GLUCONATE CLOTH 2 % EX PADS
6.0000 | MEDICATED_PAD | Freq: Every day | CUTANEOUS | Status: DC
  Administered 2019-03-24 – 2019-03-26 (×3): 6 via TOPICAL

## 2019-03-24 MED ORDER — LACTATED RINGERS IV SOLN: INTRAVENOUS | Status: DC

## 2019-03-24 NOTE — Plan of Care (Signed)

## 2019-03-24 NOTE — Progress Notes (Addendum)
PROGRESS NOTE    Ian Ree.  ZOX:[id number] DOB: 03/17/1960 DOA: 03/23/2019 PCP: Galvin Proffer, MD   Brief Narrative:  Per HPI: Ian Ree. is a 59 y.o. male who is a longterm care resident at [facility] was sent to ED by EMS from facility with reports that he has been increasingly somnolent and his behavior has become progressively nonresponsive.  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  He is a ward of the state and has a legal guardian.  I spoke with his legal guardian and she tells me that patient was treated for Covid pneumonia in [place] in early January 2021.  After recovery he has been at [facility].  He is on a pureed diet and requires assistance with feeding at baseline.  He is DNR/DNI.  He has history of chronic malnutrition and hypotension.  He has history of generalized seizures and is taking antiepileptics.  He is bedbound and has not been ambulatory in many years.   EMS found him hypoxic to 75% on RA and placed him on NRB and transported him to ED.   2/24: Patient was admitted with severe sepsis secondary to presumed nosocomial pneumonia with left-sided infiltrate noted. He was started on vancomycin, cefepime and Flagyl with vancomycin discontinued today. He continues to have lactic acidosis for which IV fluid will be increased. Unknown baseline level of functioning. Will discuss with guardian. He will require ongoing treatment for now with plans to discharge back to [facility] once improved.  We will plan to consult palliative care today as he appears to have some failure to thrive and severe dementia with poor prognosis of survival long-term.  Assessment & Plan:   Principal Problem:   Severe sepsis (HCC) Active Problems:   Leukocytosis   DNR (do not resuscitate)   Dementia with Lewy bodies (HCC)   Cognitive communication deficit   HCAP  (healthcare-associated pneumonia)   Hypotension, unspecified   Aphasia-angular gyrus syndrome   Cannot walk   Parkinsons disease (HCC)   CN (constipation)   Generalized-onset seizures (HCC)   AKI (acute kidney injury) (HCC)   Severe dehydration   Sinus tachycardia   History of COVID-19   Lactic acidosis   1. Severe sepsis - secondary to nosocomial pneumonia -continue on cefepime and Flagyl with vancomycin discontinued as MRSA is negative. Procalcitonin still remains elevated as well as lactic acid and patient still has leukocytosis. Will need ongoing close monitoring. 2. Acute versus chronic encephalopathy related to above. Needs ongoing careful monitoring. 3. Leukocytosis - Follow CBC with differential daily as treatment progresses.  4. Severe dehydration - IV fluids ordered.   Changed to LR with increased rate. Will monitor lactic acid and labs in a.m. 5. HCAP - IV antibiotics as ordered, supportive care as ordered. Continue now on cefepime and Flagyl. 6. Acute respiratory failure with hypoxia - secondary to HCAP, he has responded nicely to supplemental oxygen which is contined.  7. Epilepsy - resume home valproic acid solution 500 mg BID.  Seizure precautions.  8. Severe dementia - At baseline patient requires full support for all ADLs.   9. AKI - prerenal, treating with IV fluid hydration.  10. Sinus tachycardia - treating acute illness as above, hydrating, cardiac monitoring.  11. History of Covid 19 Pneumonia - I spoke with his legal guardian and he was treated for Covid pneumonia at a  hospital in [place] in early January 2021, that being more than 21 days ago, would not place him on Covid isolation per health system protocol.  12. Lactic acidosis - markedly elevated lactic acid, in the setting of sepsis and severe dehydration would hydrate follow lactate a with downward trend noted. I explained to sepsis RN that I do not feel PCCM consult is necessary at this time, but would  re-evaluate if lactic acid not coming down of patient's condition deteriorates further after these aggressive treatments.   13. Chronic hypotension - home meds resumed, follow closely.  Has been exacerbated by sepsis but responding well to hydration. Follow.  14. Fever - antipyretics ordered as needed for comfort.   15. DNR present on admission - confirmed with legal guardian, will honor DNR/DNI status while in hospital.    DVT prophylaxis: Heparin Code Status: DNR Family Communication: Called legal guardian 2/24 Disposition Plan: Continue ongoing stepdown level of care with IV fluids and IV antibiotics with frequent clinical monitoring. Repeat labs in a.m. and de-escalate care as appropriate. Plan to discharge back to [facility] once improved.  Palliative consultation today.   Consultants:   Palliative care  Procedures:   See below  Antimicrobials:  Anti-infectives (From admission, onward)   Start     Dose/Rate Route Frequency Ordered Stop   03/24/19 1200  vancomycin (VANCOREADY) IVPB 500 mg/100 mL  Status:  Discontinued     500 mg 100 mL/hr over 60 Minutes Intravenous Every 24 hours 03/23/19 1304 03/24/19 0744   03/24/19 0000  vancomycin (VANCOREADY) IVPB 500 mg/100 mL  Status:  Discontinued     500 mg 100 mL/hr over 60 Minutes Intravenous Every 12 hours 03/23/19 1208 03/23/19 1304   03/24/19 0000  ceFEPIme (MAXIPIME) 2 g in sodium chloride 0.9 % 100 mL IVPB     2 g 200 mL/hr over 30 Minutes Intravenous Every 12 hours 03/23/19 1301     03/23/19 2200  ceFEPIme (MAXIPIME) 2 g in sodium chloride 0.9 % 100 mL IVPB  Status:  Discontinued     2 g 200 mL/hr over 30 Minutes Intravenous Every 8 hours 03/23/19 1205 03/23/19 1301   03/23/19 2000  metroNIDAZOLE (FLAGYL) IVPB 500 mg     500 mg 100 mL/hr over 60 Minutes Intravenous Every 8 hours 03/23/19 1828     03/23/19 1100  ceFEPIme (MAXIPIME) 2 g in sodium chloride 0.9 % 100 mL IVPB     2 g 200 mL/hr over 30 Minutes Intravenous   Once 03/23/19 1058 03/23/19 1216   03/23/19 1100  metroNIDAZOLE (FLAGYL) IVPB 500 mg     500 mg 100 mL/hr over 60 Minutes Intravenous  Once 03/23/19 1058 03/23/19 1247   03/23/19 1100  vancomycin (VANCOCIN) IVPB 1000 mg/200 mL premix     1,000 mg 200 mL/hr over 60 Minutes Intravenous  Once 03/23/19 1058 03/23/19 1247       Subjective: Patient seen and evaluated today and is mostly unresponsive to any questioning. No acute overnight events noted.  Objective: Vitals:   03/24/19 0700 03/24/19 0800 03/24/19 0811 03/24/19 0833  BP: 120/68 115/70    Pulse:      Resp: 13 13    Temp:   98.3 F (36.8 C)   TempSrc:   Axillary   SpO2: 100%   99%  Weight:      Height:        Intake/Output Summary (Last 24 hours) at 03/24/2019 1021 Last data filed at 03/24/2019 7619 Gross per  24 hour  Intake 2227.54 ml  Output 1300 ml  Net 927.54 ml   Filed Weights   03/23/19 1105 03/23/19 1830 03/24/19 0500  Weight: 53.5 kg 52.4 kg 52 kg    Examination:  General exam: Appears calm and comfortable, unresponsive to questioning Respiratory system: Clear to auscultation. Respiratory effort normal. Currently on 3 L nasal cannula oxygen. Cardiovascular system: S1 & S2 heard, RRR. No JVD, murmurs, rubs, gallops or clicks. No pedal edema. Gastrointestinal system: Abdomen is nondistended, soft and nontender. No organomegaly or masses felt. Normal bowel sounds heard. Central nervous system: Alert and makes eye contact. Extremities: No edema. Prevalon boots present Skin: No rashes, lesions or ulcers Psychiatry: Cannot be evaluated.    Data Reviewed: I have personally reviewed following labs and imaging studies  CBC: Recent Labs  Lab 03/23/19 1129 03/24/19 0411  WBC 15.3* 12.7*  NEUTROABS 12.6* 10.3*  HGB 11.2* 9.2*  HCT 38.1* 32.1*  MCV 103.5* 106.3*  PLT 310 265   Basic Metabolic Panel: Recent Labs  Lab 03/23/19 1129 03/24/19 0411  NA 145 145  K 4.9 4.4  CL 109 111  CO2 22 25    GLUCOSE 104* 111*  BUN 39* 30*  CREATININE 1.74* 1.09  CALCIUM 8.0* 8.3*  MG  --  2.1   GFR: Estimated Creatinine Clearance: 54.3 mL/min (by C-G formula based on SCr of 1.09 mg/dL). Liver Function Tests: Recent Labs  Lab 03/23/19 1129 03/24/19 0411  AST 44* 51*  ALT 36 37  ALKPHOS 35* 34*  BILITOT 0.8 0.4  PROT 6.2* 6.2*  ALBUMIN 2.4* 2.2*   No results for input(s): LIPASE, AMYLASE in the last 168 hours. No results for input(s): AMMONIA in the last 168 hours. Coagulation Profile: Recent Labs  Lab 03/23/19 1320  INR 1.4*   Cardiac Enzymes: No results for input(s): CKTOTAL, CKMB, CKMBINDEX, TROPONINI in the last 168 hours. BNP (last 3 results) No results for input(s): PROBNP in the last 8760 hours. HbA1C: No results for input(s): HGBA1C in the last 72 hours. CBG: No results for input(s): GLUCAP in the last 168 hours. Lipid Profile: No results for input(s): CHOL, HDL, LDLCALC, TRIG, CHOLHDL, LDLDIRECT in the last 72 hours. Thyroid Function Tests: No results for input(s): TSH, T4TOTAL, FREET4, T3FREE, THYROIDAB in the last 72 hours. Anemia Panel: No results for input(s): VITAMINB12, FOLATE, FERRITIN, TIBC, IRON, RETICCTPCT in the last 72 hours. Sepsis Labs: Recent Labs  Lab 03/23/19 1129 03/23/19 1629 03/24/19 0745 03/24/19 0853  PROCALCITON  --   --  5.25  --   LATICACIDVEN 6.0* 4.4*  --  4.1*    Recent Results (from the past 240 hour(s))  Blood Culture (routine x 2)     Status: None (Preliminary result)   Collection Time: 03/23/19 10:57 AM   Specimen: BLOOD RIGHT FOREARM  Result Value Ref Range Status   Specimen Description BLOOD RIGHT FOREARM DRAWN BY RN [phone]  Final   Special Requests   Final    Blood Culture results may not be optimal due to an inadequate volume of blood received in culture bottles   Culture   Final    NO GROWTH < 24 HOURS Performed at [facility], [address] [place], [place] [address]    Report Status PENDING  Incomplete   Blood Culture (routine x 2)     Status: None (Preliminary result)   Collection Time: 03/23/19 11:29 AM   Specimen: BLOOD  Result Value Ref Range Status   Specimen Description BLOOD  Final   Special Requests NONE  Final   Culture   Final    NO GROWTH < 24 HOURS Performed at [facility], [address], [place], [place] [address]    Report Status PENDING  Incomplete  Respiratory Panel by RT PCR (Flu A&B, Covid) - Nasopharyngeal Swab     Status: None   Collection Time: 03/23/19  2:45 PM   Specimen: Nasopharyngeal Swab  Result Value Ref Range Status   SARS Coronavirus 2 by RT PCR NEGATIVE NEGATIVE Final    Comment: (NOTE) SARS-CoV-2 target nucleic acids are NOT DETECTED. The SARS-CoV-2 RNA is generally detectable in upper respiratoy specimens during the acute phase of infection. The lowest concentration of SARS-CoV-2 viral copies this assay can detect is 131 copies/mL. A negative result does not preclude SARS-Cov-2 infection and should not be used as the sole basis for treatment or other patient management decisions. A negative result may occur with  improper specimen collection/handling, submission of specimen other than nasopharyngeal swab, presence of viral mutation(s) within the areas targeted by this assay, and inadequate number of viral copies (<131 copies/mL). A negative result must be combined with clinical observations, patient history, and epidemiological information. The expected result is Negative. Fact Sheet for Patients:  PinkCheek.be Fact Sheet for Healthcare Providers:  GravelBags.it This test is not yet ap proved or cleared by the [place] FDA and  has been authorized for detection and/or diagnosis of SARS-CoV-2 by FDA under an Emergency Use Authorization (EUA). This EUA will remain  in effect (meaning this test can be used) for the duration of the COVID-19 declaration under Section 564(b)(1) of the  Act, 21 U.S.C. section 360bbb-3(b)(1), unless the authorization is terminated or revoked sooner.    Influenza A by PCR NEGATIVE NEGATIVE Final   Influenza B by PCR NEGATIVE NEGATIVE Final    Comment: (NOTE) The Xpert Xpress SARS-CoV-2/FLU/RSV assay is intended as an aid in  the diagnosis of influenza from Nasopharyngeal swab specimens and  should not be used as a sole basis for treatment. Nasal washings and  aspirates are unacceptable for Xpert Xpress SARS-CoV-2/FLU/RSV  testing. Fact Sheet for Patients: PinkCheek.be Fact Sheet for Healthcare Providers: GravelBags.it This test is not yet approved or cleared by the [place] FDA and  has been authorized for detection and/or diagnosis of SARS-CoV-2 by  FDA under an Emergency Use Authorization (EUA). This EUA will remain  in effect (meaning this test can be used) for the duration of the  Covid-19 declaration under Section 564(b)(1) of the Act, 21  U.S.C. section 360bbb-3(b)(1), unless the authorization is  terminated or revoked. Performed at [facility], [address] [place], [place] [address]   MRSA PCR Screening     Status: None   Collection Time: 03/23/19  6:29 PM   Specimen: Nasal Mucosa; Nasopharyngeal  Result Value Ref Range Status   MRSA by PCR NEGATIVE NEGATIVE Final    Comment:        The GeneXpert MRSA Assay (FDA approved for NASAL specimens only), is one component of a comprehensive MRSA colonization surveillance program. It is not intended to diagnose MRSA infection nor to guide or monitor treatment for MRSA infections. Performed at [facility], [address] [place], [place] [address]          Radiology Studies: [facility] 1 View  Result Date: 03/23/2019 CLINICAL DATA:  Altered mental status.  Cough.  Fever. EXAM: PORTABLE CHEST 1 VIEW COMPARISON:  No prior. FINDINGS: Mediastinum and hilar  structures normal. Heart size normal. Left base  infiltrate. Small left pleural effusion. No pneumothorax. IMPRESSION: Left base infiltrate suggesting pneumonia. Associated small left pleural effusion. Electronically Signed   By: Maisie Fus  Register   On: 03/23/2019 11:20        Scheduled Meds: . ascorbic acid  500 mg Oral BID  . chlorhexidine  15 mL Mouth Rinse BID  . Chlorhexidine Gluconate Cloth  6 each Topical Daily  . feeding supplement (PRO-STAT 64)  30 mL Oral BID  . fludrocortisone  0.1 mg Oral Daily  . heparin  5,000 Units Subcutaneous Q8H  . ipratropium-albuterol  3 mL Nebulization TID  . mouth rinse  15 mL Mouth Rinse q12n4p  . midodrine  10 mg Oral TID WC  . multivitamin with minerals  1 tablet Oral Daily  . risperiDONE  0.5 mg Oral BID  . sertraline  100 mg Oral Daily  . Valproate Sodium  500 mg Oral BID   Continuous Infusions: . ceFEPime (MAXIPIME) IV 2 g (03/24/19 0058)  . lactated ringers    . metronidazole 500 mg (03/24/19 0328)     LOS: 1 day    Time spent: 30 minutes    Reynard Christoffersen Hoover Brunette, DO [facility] Pager [phone]  If 7PM-7AM, please contact night-coverage [email or web address] Password [facility] 03/24/2019, 10:21 AM

## 2019-03-24 NOTE — Progress Notes (Signed)
Patient's temp 101.4 orally and heart rate sinus tach between 100-119. PRN tylenol given and patient tolerated well. Dr Sherryll Burger aware.

## 2019-03-24 NOTE — Evaluation (Signed)
Clinical/Bedside Swallow Evaluation Patient Details  Name: Ian Travis. MRN: [id number] Date of Birth: [birth year]  Today's Date: 03/24/2019 Time: SLP Start Time (ACUTE ONLY): 1440 SLP Stop Time (ACUTE ONLY): 1457 SLP Time Calculation (min) (ACUTE ONLY): 17 min  Past Medical History:  Past Medical History:  Diagnosis Date  . Acute respiratory failure with hypoxia ([place])   . Aphasia-angular gyrus syndrome   . Cannot walk   . CN (constipation)   . Cognitive communication deficit   . Dementia of the Alzheimer's type, with late onset, with delusions ([place])   . Dementia with behavioral disturbance ([place])   . Dementia with Lewy bodies ([place])   . Generalized-onset seizures ([place])   . Hypotension, unspecified   . Metabolic encephalopathy   . Muscle weakness (generalized)   . Parkinsons disease ([place])   . Persistent disorder of initiating or maintaining sleep   . Personal care impairment   . Pneumonia due to 2019-nCoV   . 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 Recurrent major depression ([place])   . Repeated falls   . Somatosensory attacks (HCC)    Past Surgical History: History reviewed. No pertinent surgical history. HPI:  Ian Travis. is a 59 y.o. male who is a longterm care resident at [facility] was sent to ED by EMS from facility with reports that he has been increasingly somnolent and his behavior has become progressively nonresponsive.  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  He is a ward of the state and has a legal guardian.  I spoke with his legal guardian and she tells me that patient was treated for Covid pneumonia in [place] in early January 2021.  After recovery he has been at [facility]  He is on a pureed diet and requires assistance with feeding at baseline.  He is DNR/DNI.  He has history of chronic malnutrition and hypotension.  He  has history of generalized seizures and is taking antiepileptics.  He is bedbound and has not been ambulatory in many years.   EMS found him hypoxic to 75% on RA and placed him on NRB and transported him to ED. Patient was admitted with severe sepsis secondary to presumed nosocomial pneumonia with left-sided infiltrate noted. He was started on vancomycin, cefepime and Flagyl with vancomycin discontinued today. Palliative care has been consulted. BSE requested as RN noted coughing with liquids.   Assessment / Plan / Recommendation Clinical Impression  Clinical swallowing evaluation completed at bedside with limited trials; Pt required max verbal and tactile cues to rouse for evaluation. Note congested sounding breathing at baseline. Pt consumed a few single ice chips and note prolonged oral holding with eventual reflexive swallow trigger with delayed congested sounding throat clear. No overt coughing was noted with these limited ice trials however RN reports with thin liquids at breakfast that Pt demonstrated immediate coughing. One bolus of puree was presented however Pt held it in his mouth for ~10 seconds and eventually swallowed a portion of it and moderate residue was manually removed from his mouth. Evaluation was terminated secondary to Pt slipping back into a state of somnolence despite max cues, he did not rouse. Recommend continue with puree diet and downgrade to HONEY thick liquids to be adminsitered via spoon. ST will f/u tomorrow for diet tolerance and will consider recommendation for objective study if Pt is appropriate. Further, recommend Pt  ONLY be fed when completely alert and sitting upright. Recommend medications to be administered crushed in puree. Note Palliative encounter- will follow for goals of care. SLP Visit Diagnosis: Dysphagia, unspecified (R13.10)    Aspiration Risk  Severe aspiration risk    Diet Recommendation     Liquid Administration via: Spoon Medication Administration:  Crushed with puree Supervision: Full supervision/cueing for compensatory strategies Compensations: Effortful swallow;Follow solids with liquid;Slow rate;Minimize environmental distractions;Small sips/bites Postural Changes: Remain upright for at least 30 minutes after po intake;Seated upright at 90 degrees    Other  Recommendations Oral Care Recommendations: Oral care BID Other Recommendations: Order thickener from pharmacy   Follow up Recommendations 24 hour supervision/assistance;Skilled Nursing facility      Frequency and Duration min 1 x/week  1 week       Prognosis Prognosis for Safe Diet Advancement: Good Barriers to Reach Goals: Cognitive deficits;Language deficits;Severity of deficits      Swallow Study   General Date of Onset: 03/23/19 HPI: Ian Travis. is a 59 y.o. male who is a longterm care resident at [facility] was sent to ED by EMS from facility with reports that he has been increasingly somnolent and his behavior has become progressively nonresponsive.  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  He is a ward of the state and has a legal guardian.  I spoke with his legal guardian and she tells me that patient was treated for Covid pneumonia in [place] in early January 2021.  After recovery he has been at [facility].  He is on a pureed diet and requires assistance with feeding at baseline.  He is DNR/DNI.  He has history of chronic malnutrition and hypotension.  He has history of generalized seizures and is taking antiepileptics.  He is bedbound and has not been ambulatory in many years.   EMS found him hypoxic to 75% on RA and placed him on NRB and transported him to ED. Patient was admitted with severe sepsis secondary to presumed nosocomial pneumonia with left-sided infiltrate noted. He was started on vancomycin, cefepime and Flagyl with vancomycin  discontinued today. Palliative care has been consulted. BSE requested as RN noted coughing with liquids. Type of Study: Bedside Swallow Evaluation Previous Swallow Assessment: none in chart Diet Prior to this Study: Dysphagia 1 (puree);Thin liquids Temperature Spikes Noted: No Respiratory Status: Nasal cannula History of Recent Intubation: No Behavior/Cognition: Lethargic/Drowsy;Doesn't follow directions;Requires cueing Oral Cavity Assessment: Dry Oral Care Completed by SLP: Recent completion by staff Oral Cavity - Dentition: Adequate natural dentition Self-Feeding Abilities: Total assist Patient Positioning: Upright in bed Volitional Cough: Cognitively unable to elicit Volitional Swallow: Unable to elicit    Oral/Motor/Sensory Function Overall Oral Motor/Sensory Function: Within functional limits   Ice Chips Ice chips: Impaired Presentation: Spoon Oral Phase Impairments: Poor awareness of bolus;Reduced lingual movement/coordination Pharyngeal Phase Impairments: Suspected delayed Swallow;Wet Vocal Quality;Multiple swallows;Throat Clearing - Delayed   Thin Liquid Thin Liquid: Not tested    Nectar Thick Nectar Thick Liquid: Not tested   Honey Thick Honey Thick Liquid: Not tested   Puree Puree: Impaired Presentation: Spoon Oral Phase Impairments: Poor awareness of bolus;Reduced lingual movement/coordination Oral Phase Functional Implications: Oral residue;Oral holding Pharyngeal Phase Impairments: Multiple swallows;Suspected delayed Swallow   Solid     Solid: Not tested     Anahli Arvanitis H. Romie Levee, CCC-SLP Speech Language Pathologist  Georgetta Haber 03/24/2019,4:15  PM

## 2019-03-24 NOTE — Progress Notes (Signed)
Patient noted to have a wet, weak cough after swallowing thin liquids. Dr Sherryll Burger made aware and speech eval ordered. Lactic level noted to be 4.1 this morning. Dr Sherryll Burger aware.

## 2019-03-24 NOTE — Consult Note (Signed)
Consultation Note Date: 03/24/2019   Patient Name: Ian Travis.  DOB: December 09, 1960  MRN: [id number]  Age / Sex: 59 y.o., male  PCP: Galvin Proffer, MD Referring Physician: Erick Blinks, DO  Reason for Consultation: Establishing goals of care  HPI/Patient Profile: 59 y.o. male  with past medical history of severe dementia with lewy bodies and Parkinson's disease, bedbound status, seizures, hypotension recent COVID 19 infection admitted on 03/23/2019 with increasing somnolence. Hospital admission for severe sepsis secondary to presumed nosocomial pneumonia with left-sided infiltrate on CXR. Receiving IV antibiotics and IVF. Palliative medicine consultation for goals of care.   Clinical Assessment and Goals of Care:  I have reviewed medical records, discussed with Dr. Sherryll Burger and RN and assessed patient at bedside. He is lethargic. Baseline mostly non-verbal and unable to participate in discussion. No s/s of pain or discomfort.   Spoke with patient's legal guardian, Ian Travis via telephone to discuss goals of care.   I introduced Palliative Medicine as specialized medical care for people living with serious illness. It focuses on providing relief from the symptoms and stress of a serious illness. The goal is to improve quality of life for both the patient and the family.  Keanon has been appointed guardianship since 2017. Ian does share that he has a father and brother that she communicates with. Baseline, he is completely dependent of ADL's. He is bedbound and contracted. She recently moved him to [facility] long-term care facility. Covid infection in January 2021.   Discussed events leading up to admission and course of hospitalization including diagnoses, interventions, plan of care. Discussed high risk for ongoing decline leading to recurrent hospitalizations due to chronic, progressive dementia  and poor baseline/dependent status prior to admit.   Ian is very appreciative of updates from the care team. She is realistic of poor long-term prognosis.   Ian confirms DNR/DNI code status. She wishes to continue current medical management with IVF and antibiotics. Discussed SLP evaluation today.  Discussed recommendation for hospice services at SNF on discharge. Educated on hospice philosophy including focus on comfort, quality of life, symptom management, allowing nature to take course, and preventing recurrent hospitalization as his condition progresses. Discussed prognosis of likely 6 months if not less and eligibility for hospice enrollment. Ian agrees with initiation of hospice services if he stabilizes and can discharge back to nursing facility.   Discussed medical management, medical optimization, and discharge plan back to SNF with hospice. Ian understands and agrees with this plan.   Questions addressed. PMT contact information given. Reassured Ian that I will f/u with her tomorrow to provide update on Iden' condition.     SUMMARY OF RECOMMENDATIONS    DNR/DNI  Patient has a legal guardian: Ian Travis [phone]).   Continue current plan of care and medical management with IVF/ABX. If clinical decline inpatient, further discussions with legal guardian regarding full shift to comfort measures.   SLP evaluation today.  Introduced hospice philosophy and recommendation for enrollment in hospice at [facility] on discharge  with poor long-term prognosis. Ian is agreeable with hospice services at SNF on discharge.  Medical optimization with discharge plan back to SNF with hospice.   PMT will f/u tomorrow and provide guardian update.  Code Status/Advance Care Planning:  DNR  Symptom Management:   Per attending  Palliative Prophylaxis:   Aspiration, Delirium Protocol, Oral Care and Turn Reposition  Psycho-social/Spiritual:   Desire  for further Chaplaincy support:yes  Additional Recommendations: Caregiving  Support/Resources, Compassionate Wean Education and Education on Hospice  Prognosis:   Poor long-term prognosis  Discharge Planning: Skilled Nursing Facility with Hospice      Primary Diagnoses: Present on Admission: . Severe sepsis (HCC) . Leukocytosis . DNR (do not resuscitate) . Dementia with Lewy bodies (HCC) . HCAP (healthcare-associated pneumonia) . Hypotension, unspecified . Aphasia-angular gyrus syndrome . Cannot walk . Parkinsons disease (HCC) . CN (constipation) . AKI (acute kidney injury) (HCC) . Severe dehydration . Sinus tachycardia . History of COVID-19 . Lactic acidosis   I have reviewed the medical record, interviewed the patient and family, and examined the patient. The following aspects are pertinent.  Past Medical History:  Diagnosis Date  . Acute respiratory failure with hypoxia (HCC)   . Aphasia-angular gyrus syndrome   . Cannot walk   . CN (constipation)   . Cognitive communication deficit   . Dementia of the Alzheimer's type, with late onset, with delusions (HCC)   . Dementia with behavioral disturbance (HCC)   . Dementia with Lewy bodies (HCC)   . Generalized-onset seizures (HCC)   . Hypotension, unspecified   . Metabolic encephalopathy   . Muscle weakness (generalized)   . Parkinsons disease (HCC)   . Persistent disorder of initiating or maintaining sleep   . Personal care impairment   . Pneumonia due to 2019-nCoV   . Presenile dementia with depressive features (HCC)   . Recurrent major depression (HCC)   . Repeated falls   . Somatosensory attacks (HCC)    Social History   Socioeconomic History  . Marital status: Single    Spouse name: Not on file  . Number of children: Not on file  . Years of education: Not on file  . Highest education level: Not on file  Occupational History  . Not on file  Tobacco Use  . Smoking status: Never Smoker  . Smokeless  tobacco: Never Used  Substance and Sexual Activity  . Alcohol use: Not Currently  . Drug use: Not Currently  . Sexual activity: Not Currently  Other Topics Concern  . Not on file  Social History Narrative  . Not on file   Social Determinants of Health   Financial Resource Strain:   . Difficulty of Paying Living Expenses: Not on file  Food Insecurity:   . Worried About Programme researcher, broadcasting/film/video in the Last Year: Not on file  . Ran Out of Food in the Last Year: Not on file  Transportation Needs:   . Lack of Transportation (Medical): Not on file  . Lack of Transportation (Non-Medical): Not on file  Physical Activity:   . Days of Exercise per Week: Not on file  . Minutes of Exercise per Session: Not on file  Stress:   . Feeling of Stress : Not on file  Social Connections:   . Frequency of Communication with Friends and Family: Not on file  . Frequency of Social Gatherings with Friends and Family: Not on file  . Attends Religious Services: Not on file  . Active Member of Clubs  or Organizations: Not on file  . Attends Archivist Meetings: Not on file  . Marital Status: Not on file   History reviewed. No pertinent family history. Scheduled Meds: . ascorbic acid  500 mg Oral BID  . chlorhexidine  15 mL Mouth Rinse BID  . Chlorhexidine Gluconate Cloth  6 each Topical Daily  . feeding supplement (PRO-STAT 64)  30 mL Oral BID  . fludrocortisone  0.1 mg Oral Daily  . heparin  5,000 Units Subcutaneous Q8H  . ipratropium-albuterol  3 mL Nebulization TID  . mouth rinse  15 mL Mouth Rinse q12n4p  . midodrine  10 mg Oral TID WC  . multivitamin with minerals  1 tablet Oral Daily  . risperiDONE  0.5 mg Oral BID  . sertraline  100 mg Oral Daily  . Valproate Sodium  500 mg Oral BID   Continuous Infusions: . ceFEPime (MAXIPIME) IV Stopped (03/24/19 1235)  . lactated ringers 125 mL/hr at 03/24/19 1502  . metronidazole Stopped (03/24/19 1353)   PRN Meds:.acetaminophen **OR**  acetaminophen, bisacodyl, ondansetron **OR** ondansetron (ZOFRAN) IV Medications Prior to Admission:  Prior to Admission medications   Medication Sig Start Date End Date Taking? Authorizing Provider  fludrocortisone (FLORINEF) 0.1 MG tablet Take 0.1 mg by mouth daily.   Yes [provider]  midodrine (PROAMATINE) 10 MG tablet Take 10 mg by mouth 3 (three) times daily.   Yes [provider]  risperiDONE (RISPERDAL) 0.5 MG tablet Take 0.5 mg by mouth 2 (two) times daily.   Yes [provider]  sertraline (ZOLOFT) 100 MG tablet Take 100 mg by mouth daily.   Yes [provider]  Valproate Sodium (DEPAKENE) 250 MG/5ML SOLN solution Take 500 mg by mouth 2 (two) times daily.   Yes [provider]   No Known Allergies Review of Systems  Unable to perform ROS: Dementia   Physical Exam Vitals and nursing note reviewed.  Constitutional:      Appearance: He is cachectic. He is ill-appearing.  HENT:     Head: Normocephalic and atraumatic.  Cardiovascular:     Rate and Rhythm: Normal rate.  Pulmonary:     Effort: No tachypnea, accessory muscle usage or respiratory distress.     Breath sounds: Decreased breath sounds present.  Abdominal:     Tenderness: There is no abdominal tenderness.  Skin:    General: Skin is warm and dry.     Coloration: Skin is pale.  Neurological:     Mental Status: He is lethargic.     Comments: Baseline mostly non-verbal     Vital Signs: BP 108/69   Pulse 91   Temp 98.3 F (36.8 C) (Axillary)   Resp 19   Ht 6\' 1"  (1.854 m)   Wt 52 kg   SpO2 94%   BMI 15.12 kg/m  Pain Scale: CPOT POSS *See Group Information*: S-Acceptable,Sleep, easy to arouse Pain Score: Asleep   SpO2: SpO2: 94 % O2 Device:SpO2: 94 % O2 Flow Rate: .O2 Flow Rate (L/min): 3 L/min  IO: Intake/output summary:   Intake/Output Summary (Last 24 hours) at 03/24/2019 1519 Last data filed at 03/24/2019 1502 Gross per 24 hour  Intake 1036.61 ml    Output 1300 ml  Net -263.39 ml    LBM: Last BM Date: (UTA) Baseline Weight: Weight: 53.5 kg Most recent weight: Weight: 52 kg     Palliative Assessment/Data: PPS 30%   Flowsheet Rows     Most Recent Value  Intake Tab  Referral Department  Hospitalist  Unit at Time of Referral  Intermediate Care Unit  Palliative Care Primary Diagnosis  Sepsis/Infectious Disease  Palliative Care Type  New Palliative care  Reason for referral  Clarify Goals of Care  Date first seen by Palliative Care  03/24/19  Clinical Assessment  Palliative Performance Scale Score  30%  Psychosocial & Spiritual Assessment  Palliative Care Outcomes  Patient/Family meeting held?  Yes  Who was at the meeting?  legal guardian via telephone  Palliative Care Outcomes  Clarified goals of care, Provided end of life care assistance, Counseled regarding hospice, Provided psychosocial or spiritual support, ACP counseling assistance      Time In: 1440 Time Out: 1520 Time Total: 40 Greater than 50%  of this time was spent counseling and coordinating care related to the above assessment and plan.  Signed by:  Vennie Homans, DNP, FNP-C Palliative Medicine Team  Phone: [phone] Fax: [phone]   Please contact Palliative Medicine Team phone at [phone] for questions and concerns.  For individual provider: See Loretha Stapler

## 2019-03-25 LAB — MAGNESIUM: Magnesium: 1.9 mg/dL (ref 1.7–2.4)

## 2019-03-25 LAB — CBC WITH DIFFERENTIAL/PLATELET
Abs Immature Granulocytes: 0.05 K/uL (ref 0.00–0.07)
Basophils Absolute: 0 K/uL (ref 0.0–0.1)
Basophils Relative: 0 %
Eosinophils Absolute: 0 K/uL (ref 0.0–0.5)
Eosinophils Relative: 0 %
HCT: 25.4 % — ABNORMAL LOW (ref 39.0–52.0)
Hemoglobin: 7.8 g/dL — ABNORMAL LOW (ref 13.0–17.0)
Immature Granulocytes: 0 %
Lymphocytes Relative: 3 %
Lymphs Abs: 0.4 K/uL — ABNORMAL LOW (ref 0.7–4.0)
MCH: 30.5 pg (ref 26.0–34.0)
MCHC: 30.7 g/dL (ref 30.0–36.0)
MCV: 99.2 fL (ref 80.0–100.0)
Monocytes Absolute: 0.9 K/uL (ref 0.1–1.0)
Monocytes Relative: 8 %
Neutro Abs: 10.3 K/uL — ABNORMAL HIGH (ref 1.7–7.7)
Neutrophils Relative %: 89 %
Platelets: 324 K/uL (ref 150–400)
RBC: 2.56 MIL/uL — ABNORMAL LOW (ref 4.22–5.81)
RDW: 13 % (ref 11.5–15.5)
WBC: 11.6 K/uL — ABNORMAL HIGH (ref 4.0–10.5)
nRBC: 0 % (ref 0.0–0.2)

## 2019-03-25 LAB — COMPREHENSIVE METABOLIC PANEL WITH GFR
ALT: 54 U/L — ABNORMAL HIGH (ref 0–44)
AST: 82 U/L — ABNORMAL HIGH (ref 15–41)
Albumin: 1.9 g/dL — ABNORMAL LOW (ref 3.5–5.0)
Alkaline Phosphatase: 32 U/L — ABNORMAL LOW (ref 38–126)
Anion gap: 8 (ref 5–15)
BUN: 24 mg/dL — ABNORMAL HIGH (ref 6–20)
CO2: 26 mmol/L (ref 22–32)
Calcium: 7.9 mg/dL — ABNORMAL LOW (ref 8.9–10.3)
Chloride: 114 mmol/L — ABNORMAL HIGH (ref 98–111)
Creatinine, Ser: 0.86 mg/dL (ref 0.61–1.24)
GFR calc Af Amer: >60 mL/min
GFR calc non Af Amer: >60 mL/min
Glucose, Bld: 102 mg/dL — ABNORMAL HIGH (ref 70–99)
Potassium: 3.3 mmol/L — ABNORMAL LOW (ref 3.5–5.1)
Sodium: 148 mmol/L — ABNORMAL HIGH (ref 135–145)
Total Bilirubin: 0.5 mg/dL (ref 0.3–1.2)
Total Protein: 5.4 g/dL — ABNORMAL LOW (ref 6.5–8.1)

## 2019-03-25 LAB — VALPROIC ACID LEVEL: Valproic Acid Lvl: 13 ug/mL — ABNORMAL LOW (ref 50.0–100.0)

## 2019-03-25 LAB — PROCALCITONIN: Procalcitonin: 3.67 ng/mL

## 2019-03-25 LAB — LACTIC ACID, PLASMA: Lactic Acid, Venous: 0.9 mmol/L (ref 0.5–1.9)

## 2019-03-25 MED ORDER — POTASSIUM CHLORIDE CRYS ER 20 MEQ PO TBCR
40.0000 meq | EXTENDED_RELEASE_TABLET | Freq: Two times a day (BID) | ORAL | Status: AC
  Administered 2019-03-25 (×2): 40 meq via ORAL
  Filled 2019-03-25 (×2): qty 2

## 2019-03-25 MED ORDER — SODIUM CHLORIDE 0.9 % IV SOLN
2.0000 g | Freq: Three times a day (TID) | INTRAVENOUS | Status: DC
  Administered 2019-03-25 – 2019-03-26 (×4): 2 g via INTRAVENOUS
  Filled 2019-03-25 (×4): qty 2

## 2019-03-25 NOTE — Plan of Care (Signed)

## 2019-03-25 NOTE — Progress Notes (Signed)
PROGRESS NOTE    Ian Travis.  VOJ:[id number] DOB: [birth year] DOA: 03/23/2019 PCP: Galvin Proffer, MD   Brief Narrative:  Per HPI: Ian Travisis a 59 y.o.malewho is a longterm care resident at [facility] was sent to ED by EMS from facility with reports that he has been increasingly somnolent and his behavior has become progressively nonresponsive. 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 He is a ward of the state and has a legal guardian. I spoke with his legal guardian and she tells me that patient was treated for Covid pneumonia in [place] in early January 2021. After recovery he has been at [facility]. He is on a pureed diet and requires assistance with feeding at baseline. He is DNR/DNI. He has history of chronic malnutrition and hypotension. He has history of generalized seizures and is taking antiepileptics. He is bedbound and has not been ambulatory in many years. EMS found him hypoxic to 75% on RA and placed him on NRB and transported him to ED.   2/24: Patient was admitted with severe sepsis secondary to presumed nosocomial pneumonia with left-sided infiltrate noted. He was started on vancomycin, cefepime and Flagyl with vancomycin discontinued today. He continues to have lactic acidosis for which IV fluid will be increased. Unknown baseline level of functioning. Will discuss with guardian. He will require ongoing treatment for now with plans to discharge back to [facility] once improved.  We will plan to consult palliative care today as he appears to have some failure to thrive and severe dementia with poor prognosis of survival long-term.  2/25: Patient continues to remain on IV fluid as well as cefepime and Flagyl with improvement in lactic acidosis as well as leukocytosis this morning.  Unfortunately, he still appears to be struggling from  a clinical standpoint.  He seems appropriate for transfer to telemetry today which will be arranged.  Hopefully he can go back to [facility] on hospice care by a.m if not further improved.  Assessment & Plan:   Principal Problem:   Severe sepsis (HCC) Active Problems:   Leukocytosis   DNR (do not resuscitate)   Dementia with Lewy bodies (HCC)   Cognitive communication deficit   HCAP (healthcare-associated pneumonia)   Hypotension, unspecified   Aphasia-angular gyrus syndrome   Cannot walk   Parkinsons disease (HCC)   CN (constipation)   Generalized-onset seizures (HCC)   AKI (acute kidney injury) (HCC)   Severe dehydration   Sinus tachycardia   History of COVID-19   Lactic acidosis   Palliative care by specialist   Goals of care, counseling/discussion   1. Severe sepsis - secondary to nosocomial pneumonia -continue on cefepime and Flagyl with vancomycin discontinued as MRSA is negative. Procalcitonin still remains elevated as well as lactic acid and patient still has leukocytosis. Will need ongoing close monitoring.  Okay for transfer to telemetry today. 2. Acute versus chronic encephalopathy related to above. Needs ongoing careful monitoring. 3. Leukocytosis - Follow CBC with differential daily as treatment progresses.  4. Severe dehydration - IV fluids ordered.    Decrease LR back to her usual rate for maintenance.  Recheck lactic acid levels in a.m. 5. HCAP - IV antibiotics as ordered, supportive care as ordered. Continue now on cefepime and Flagyl.  No growth on blood or urine cultures otherwise noted. 6. Acute respiratory failure with hypoxia - secondary to  HCAP, he has responded nicely to supplemental oxygen which is contined.  7. Epilepsy - resume home valproic acid solution 500 mg BID. Seizure precautions.  8. Severe dementia - At baseline patient requires full support for all ADLs.  9. AKI - prerenal, treating with IV fluid hydration.  10. Sinus tachycardia -  treating acute illness as above, hydrating, cardiac monitoring.  11. History of Covid 19 Pneumonia - I spoke with his legal guardian and he was treated for Covid pneumonia at a hospital in [place] in early January 2021, that being more than 21 days ago, would not place him on Covid isolation per health system protocol.  12. Lactic acidosis - markedly elevated lactic acid, in the setting of sepsis and severe dehydration would hydrate follow lactate a with downward trend noted. I explained to sepsis RN that I do not feel PCCM consult is necessary at this time, but would re-evaluate if lactic acid not coming down of patient's condition deteriorates further after these aggressive treatments. 13. Chronic hypotension - home meds resumed, follow closely. Has been exacerbated by sepsis but responding well to hydration. Follow.  14. Fever - antipyretics ordered as needed for comfort.  15. DNR present on admission - confirmed with legal guardian, will honor DNR/DNI status while in hospital. He is appropriate for transfer to hospice on discharge.   DVT prophylaxis: Heparin Code Status: DNR Family Communication: Called legal guardian 2/24 Disposition Plan:  Okay for transfer to telemetry.  Continue current IV antibiotics and fluids.  Appreciate palliative care assistance with management.  Likely will need to discharge with hospice care once ready.   Consultants:   Palliative care  Procedures:   See below  Antimicrobials:  Anti-infectives (From admission, onward)   Start     Dose/Rate Route Frequency Ordered Stop   03/25/19 1200  ceFEPIme (MAXIPIME) 2 g in sodium chloride 0.9 % 100 mL IVPB     2 g 200 mL/hr over 30 Minutes Intravenous Every 8 hours 03/25/19 1054     03/24/19 1200  vancomycin (VANCOREADY) IVPB 500 mg/100 mL  Status:  Discontinued     500 mg 100 mL/hr over 60 Minutes Intravenous Every 24 hours 03/23/19 1304 03/24/19 0744   03/24/19 0000  vancomycin (VANCOREADY) IVPB 500  mg/100 mL  Status:  Discontinued     500 mg 100 mL/hr over 60 Minutes Intravenous Every 12 hours 03/23/19 1208 03/23/19 1304   03/24/19 0000  ceFEPIme (MAXIPIME) 2 g in sodium chloride 0.9 % 100 mL IVPB  Status:  Discontinued     2 g 200 mL/hr over 30 Minutes Intravenous Every 12 hours 03/23/19 1301 03/25/19 1054   03/23/19 2200  ceFEPIme (MAXIPIME) 2 g in sodium chloride 0.9 % 100 mL IVPB  Status:  Discontinued     2 g 200 mL/hr over 30 Minutes Intravenous Every 8 hours 03/23/19 1205 03/23/19 1301   03/23/19 2000  metroNIDAZOLE (FLAGYL) IVPB 500 mg     500 mg 100 mL/hr over 60 Minutes Intravenous Every 8 hours 03/23/19 1828     03/23/19 1100  ceFEPIme (MAXIPIME) 2 g in sodium chloride 0.9 % 100 mL IVPB     2 g 200 mL/hr over 30 Minutes Intravenous  Once 03/23/19 1058 03/23/19 1216   03/23/19 1100  metroNIDAZOLE (FLAGYL) IVPB 500 mg     500 mg 100 mL/hr over 60 Minutes Intravenous  Once 03/23/19 1058 03/23/19 1247   03/23/19 1100  vancomycin (VANCOCIN) IVPB 1000 mg/200 mL premix  1,000 mg 200 mL/hr over 60 Minutes Intravenous  Once 03/23/19 1058 03/23/19 1247       Subjective: Patient seen and evaluated today and is essentially unresponsive to any questioning.  No acute concerns or events noted overnight aside from some mild fever and heart rate elevations.  These appear to have improved this morning.  Objective: Vitals:   03/25/19 0700 03/25/19 0819 03/25/19 1000 03/25/19 1100  BP:   122/71 119/67  Pulse: 99  87 91  Resp: 18  (!) 21 (!) 22  Temp:      TempSrc:  Oral    SpO2: 98%  98% 96%  Weight:      Height:        Intake/Output Summary (Last 24 hours) at 03/25/2019 1154 Last data filed at 03/25/2019 0500 Gross per 24 hour  Intake 959.07 ml  Output 1050 ml  Net -90.93 ml   Filed Weights   03/23/19 1105 03/23/19 1830 03/24/19 0500  Weight: 53.5 kg 52.4 kg 52 kg    Examination:  General exam: Unresponsive to questioning and lethargic Respiratory system: Clear  to auscultation. Respiratory effort normal.  Currently on 3 L nasal cannula. Cardiovascular system: S1 & S2 heard, RRR. No JVD, murmurs, rubs, gallops or clicks. No pedal edema. Gastrointestinal system: Abdomen is nondistended, soft and nontender. No organomegaly or masses felt. Normal bowel sounds heard. Central nervous system: Lethargic and unresponsive to questioning Extremities: No edema. Skin: No rashes, lesions or ulcers Psychiatry: Cannot be assessed.    Data Reviewed: I have personally reviewed following labs and imaging studies  CBC: Recent Labs  Lab 03/23/19 1129 03/24/19 0411 03/25/19 0500  WBC 15.3* 12.7* 11.6*  NEUTROABS 12.6* 10.3* 10.3*  HGB 11.2* 9.2* 7.8*  HCT 38.1* 32.1* 25.4*  MCV 103.5* 106.3* 99.2  PLT 310 265 025   Basic Metabolic Panel: Recent Labs  Lab 03/23/19 1129 03/24/19 0411 03/25/19 0500  NA 145 145 148*  K 4.9 4.4 3.3*  CL 109 111 114*  CO2 22 25 26   GLUCOSE 104* 111* 102*  BUN 39* 30* 24*  CREATININE 1.74* 1.09 0.86  CALCIUM 8.0* 8.3* 7.9*  MG  --  2.1 1.9   GFR: Estimated Creatinine Clearance: 68.9 mL/min (by C-G formula based on SCr of 0.86 mg/dL). Liver Function Tests: Recent Labs  Lab 03/23/19 1129 03/24/19 0411 03/25/19 0500  AST 44* 51* 82*  ALT 36 37 54*  ALKPHOS 35* 34* 32*  BILITOT 0.8 0.4 0.5  PROT 6.2* 6.2* 5.4*  ALBUMIN 2.4* 2.2* 1.9*   No results for input(s): LIPASE, AMYLASE in the last 168 hours. No results for input(s): AMMONIA in the last 168 hours. Coagulation Profile: Recent Labs  Lab 03/23/19 1320  INR 1.4*   Cardiac Enzymes: No results for input(s): CKTOTAL, CKMB, CKMBINDEX, TROPONINI in the last 168 hours. BNP (last 3 results) No results for input(s): PROBNP in the last 8760 hours. HbA1C: No results for input(s): HGBA1C in the last 72 hours. CBG: No results for input(s): GLUCAP in the last 168 hours. Lipid Profile: No results for input(s): CHOL, HDL, LDLCALC, TRIG, CHOLHDL, LDLDIRECT in the  last 72 hours. Thyroid Function Tests: No results for input(s): TSH, T4TOTAL, FREET4, T3FREE, THYROIDAB in the last 72 hours. Anemia Panel: No results for input(s): VITAMINB12, FOLATE, FERRITIN, TIBC, IRON, RETICCTPCT in the last 72 hours. Sepsis Labs: Recent Labs  Lab 03/23/19 1129 03/23/19 1629 03/24/19 0745 03/24/19 0853 03/25/19 0500 03/25/19 0751  PROCALCITON  --   --  5.25  --   --  3.67  LATICACIDVEN 6.0* 4.4*  --  4.1* 0.9  --     Recent Results (from the past 240 hour(s))  Blood Culture (routine x 2)     Status: None (Preliminary result)   Collection Time: 03/23/19 10:57 AM   Specimen: BLOOD RIGHT FOREARM  Result Value Ref Range Status   Specimen Description BLOOD RIGHT FOREARM DRAWN BY RN [phone]  Final   Special Requests   Final    Blood Culture results may not be optimal due to an inadequate volume of blood received in culture bottles   Culture   Final    NO GROWTH 2 DAYS Performed at [facility], [address] [place], [place] [address]    Report Status PENDING  Incomplete  Urine culture     Status: None   Collection Time: 03/23/19 10:57 AM   Specimen: In/Out Cath Urine  Result Value Ref Range Status   Specimen Description   Final    IN/OUT CATH URINE Performed at [facility], [address] [place], [place] [address]    Special Requests   Final    NONE Performed at [facility], [address], [place], [place] [address]    Culture   Final    NO GROWTH Performed at [facility], [address], [place], [place] [address]    Report Status 03/24/2019 FINAL  Final  Blood Culture (routine x 2)     Status: None (Preliminary result)   Collection Time: 03/23/19 11:29 AM   Specimen: BLOOD  Result Value Ref Range Status   Specimen Description BLOOD  Final   Special Requests NONE  Final   Culture   Final    NO GROWTH 2 DAYS Performed at [facility], [address], [place], [place] [address]    Report Status PENDING  Incomplete  Respiratory Panel  by RT PCR (Flu A&B, Covid) - Nasopharyngeal Swab     Status: None   Collection Time: 03/23/19  2:45 PM   Specimen: Nasopharyngeal Swab  Result Value Ref Range Status   SARS Coronavirus 2 by RT PCR NEGATIVE NEGATIVE Final    Comment: (NOTE) SARS-CoV-2 target nucleic acids are NOT DETECTED. The SARS-CoV-2 RNA is generally detectable in upper respiratoy specimens during the acute phase of infection. The lowest concentration of SARS-CoV-2 viral copies this assay can detect is 131 copies/mL. A negative result does not preclude SARS-Cov-2 infection and should not be used as the sole basis for treatment or other patient management decisions. A negative result may occur with  improper specimen collection/handling, submission of specimen other than nasopharyngeal swab, presence of viral mutation(s) within the areas targeted by this assay, and inadequate number of viral copies (<131 copies/mL). A negative result must be combined with clinical observations, patient history, and epidemiological information. The expected result is Negative. Fact Sheet for Patients:  https://www.moore.com/ Fact Sheet for Healthcare Providers:  https://www.young.biz/ This test is not yet ap proved or cleared by the [place] FDA and  has been authorized for detection and/or diagnosis of SARS-CoV-2 by FDA under an Emergency Use Authorization (EUA). This EUA will remain  in effect (meaning this test can be used) for the duration of the COVID-19 declaration under Section 564(b)(1) of the Act, 21 U.S.C. section 360bbb-3(b)(1), unless the authorization is terminated or revoked sooner.    Influenza A by PCR NEGATIVE NEGATIVE Final   Influenza B by PCR NEGATIVE NEGATIVE Final    Comment: (NOTE)  The Xpert Xpress SARS-CoV-2/FLU/RSV assay is intended as an aid in  the diagnosis of influenza from Nasopharyngeal swab specimens and  should not be used as a sole basis for treatment.  Nasal washings and  aspirates are unacceptable for Xpert Xpress SARS-CoV-2/FLU/RSV  testing. Fact Sheet for Patients: https://www.moore.com/ Fact Sheet for Healthcare Providers: https://www.young.biz/ This test is not yet approved or cleared by the [place] FDA and  has been authorized for detection and/or diagnosis of SARS-CoV-2 by  FDA under an Emergency Use Authorization (EUA). This EUA will remain  in effect (meaning this test can be used) for the duration of the  Covid-19 declaration under Section 564(b)(1) of the Act, 21  U.S.C. section 360bbb-3(b)(1), unless the authorization is  terminated or revoked. Performed at [facility], [address], [place], [place] [address]   MRSA PCR Screening     Status: None   Collection Time: 03/23/19  6:29 PM   Specimen: Nasal Mucosa; Nasopharyngeal  Result Value Ref Range Status   MRSA by PCR NEGATIVE NEGATIVE Final    Comment:        The GeneXpert MRSA Assay (FDA approved for NASAL specimens only), is one component of a comprehensive MRSA colonization surveillance program. It is not intended to diagnose MRSA infection nor to guide or monitor treatment for MRSA infections. Performed at [facility], [address], [place], [place] [address]          Radiology Studies: No results found.      Scheduled Meds: . ascorbic acid  500 mg Oral BID  . chlorhexidine  15 mL Mouth Rinse BID  . Chlorhexidine Gluconate Cloth  6 each Topical Daily  . feeding supplement (PRO-STAT 64)  30 mL Oral BID  . fludrocortisone  0.1 mg Oral Daily  . heparin  5,000 Units Subcutaneous Q8H  . mouth rinse  15 mL Mouth Rinse q12n4p  . midodrine  10 mg Oral TID WC  . multivitamin with minerals  1 tablet Oral Daily  . potassium chloride  40 mEq Oral BID  . risperiDONE  0.5 mg Oral BID  . sertraline  100 mg Oral Daily  . Valproate Sodium  500 mg Oral BID   Continuous Infusions: . ceFEPime (MAXIPIME) IV      . lactated ringers 125 mL/hr at 03/25/19 0039  . metronidazole 500 mg (03/25/19 1148)     LOS: 2 days    Time spent: 30 minutes    Calem Cocozza [place] Brunette, DO [facility] Pager [phone]  If 7PM-7AM, please contact night-coverage [email or web address] Password [email or web address] 03/25/2019, 11:54 AM

## 2019-03-25 NOTE — Progress Notes (Signed)
Speech Language Pathology Treatment: Dysphagia  Patient Details Name: Ian Travis. MRN: [id number] DOB: [birth year] Today's Date: 03/25/2019 Time: 6283-1517 SLP Time Calculation (min) (ACUTE ONLY): 25 min  Assessment / Plan / Recommendation Clinical Impression  Pt seen at bedside for ongoing diagnostic dysphagia intervention following BSE completed yesterday. Pt continues to present with oral dysphagia and suspected pharyngeal dysphagia. Pt was difficult to reposition head forward and tends to tip head back. He was orally responsive to an ice chip presented on the spoon and immediately chewed and swallowed. Pt went on to consume 4 oz puree, ~60cc HTL via teaspoon presentations, ice chips, and small teaspoon presentations of water. Pt with poor oral control and suspected delay in swallow initiation with audible and multiple swallows elicited at times for each bite/sip. Pt with delayed coughing after thin water trials. Overall outlook for increased oral intake is poor at this time given cognitive status, however Pt did appear to take pleasure in po. Acknowledge that Pt is likely to discharge back to [facility] with hospice on board. In that light, recommend careful feeding of puree and HTL via teaspoon presentations with po medications crushed in puree as able; PRN ice chips after oral care for comfort. Consider liberalizing comfort feeds pending improved alertness. SLP will sign off at this time. Above to RN.    HPI HPI: Ian Travis. is a 59 y.o. male who is a longterm care resident at [facility] was sent to ED by EMS from facility with reports that he has been increasingly somnolent and his behavior has become progressively nonresponsive.  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  He is a ward of the state and has a legal guardian.  I spoke with his legal guardian and she tells me  that patient was treated for Covid pneumonia in [place] in early January 2021.  After recovery he has been at [facility].  He is on a pureed diet and requires assistance with feeding at baseline.  He is DNR/DNI.  He has history of chronic malnutrition and hypotension.  He has history of generalized seizures and is taking antiepileptics.  He is bedbound and has not been ambulatory in many years.   EMS found him hypoxic to 75% on RA and placed him on NRB and transported him to ED. Patient was admitted with severe sepsis secondary to presumed nosocomial pneumonia with left-sided infiltrate noted. He was started on vancomycin, cefepime and Flagyl with vancomycin discontinued today. Palliative care has been consulted. BSE requested as RN noted coughing with liquids.      SLP Plan  Continue with current plan of care       Recommendations  Diet recommendations: Dysphagia 1 (puree);Honey-thick liquid(via tsp and consider ice chips between meals) Liquids provided via: Teaspoon Medication Administration: Crushed with puree Supervision: Full supervision/cueing for compensatory strategies;Staff to assist with self feeding Compensations: Slow rate;Small sips/bites(oral care after po) Postural Changes and/or Swallow Maneuvers: Seated upright 90 degrees;Upright 30-60 min after meal                Oral Care Recommendations: Oral care prior to ice chip/H20;Oral care BID;Staff/trained caregiver to provide oral care Follow up Recommendations: 24 hour supervision/assistance SLP Visit Diagnosis: Dysphagia, unspecified (R13.10) Plan: Continue with current plan of care       Thank you,  Genene Churn, [place]  Ian Travis 03/25/2019, 3:32 PM

## 2019-03-26 LAB — COMPREHENSIVE METABOLIC PANEL
ALT: 48 U/L — ABNORMAL HIGH (ref 0–44)
AST: 61 U/L — ABNORMAL HIGH (ref 15–41)
Albumin: 1.7 g/dL — ABNORMAL LOW (ref 3.5–5.0)
Alkaline Phosphatase: 32 U/L — ABNORMAL LOW (ref 38–126)
Anion gap: 10 (ref 5–15)
BUN: 23 mg/dL — ABNORMAL HIGH (ref 6–20)
CO2: 27 mmol/L (ref 22–32)
Calcium: 7.9 mg/dL — ABNORMAL LOW (ref 8.9–10.3)
Chloride: 115 mmol/L — ABNORMAL HIGH (ref 98–111)
Creatinine, Ser: 0.82 mg/dL (ref 0.61–1.24)
GFR calc Af Amer: >60 mL/min (ref >60)
GFR calc non Af Amer: >60 mL/min (ref >60)
Glucose, Bld: 97 mg/dL (ref 70–99)
Potassium: 3.5 mmol/L (ref 3.5–5.1)
Sodium: 152 mmol/L — ABNORMAL HIGH (ref 135–145)
Total Bilirubin: 0.6 mg/dL (ref 0.3–1.2)
Total Protein: 5 g/dL — ABNORMAL LOW (ref 6.5–8.1)

## 2019-03-26 LAB — CBC WITH DIFFERENTIAL/PLATELET
Abs Immature Granulocytes: 0.05 10*3/uL (ref 0.00–0.07)
Basophils Absolute: 0.1 10*3/uL (ref 0.0–0.1)
Basophils Relative: 0 %
Eosinophils Absolute: 0 10*3/uL (ref 0.0–0.5)
Eosinophils Relative: 0 %
HCT: 26.9 % — ABNORMAL LOW (ref 39.0–52.0)
Hemoglobin: 7.9 g/dL — ABNORMAL LOW (ref 13.0–17.0)
Immature Granulocytes: 0 %
Lymphocytes Relative: 6 %
Lymphs Abs: 0.7 10*3/uL (ref 0.7–4.0)
MCH: 29.7 pg (ref 26.0–34.0)
MCHC: 29.4 g/dL — ABNORMAL LOW (ref 30.0–36.0)
MCV: 101.1 fL — ABNORMAL HIGH (ref 80.0–100.0)
Monocytes Absolute: 0.8 10*3/uL (ref 0.1–1.0)
Monocytes Relative: 7 %
Neutro Abs: 9.7 10*3/uL — ABNORMAL HIGH (ref 1.7–7.7)
Neutrophils Relative %: 87 %
Platelets: 320 10*3/uL (ref 150–400)
RBC: 2.66 MIL/uL — ABNORMAL LOW (ref 4.22–5.81)
RDW: 13.1 % (ref 11.5–15.5)
WBC: 11.2 10*3/uL — ABNORMAL HIGH (ref 4.0–10.5)
nRBC: 0 % (ref 0.0–0.2)

## 2019-03-26 LAB — PROCALCITONIN: Procalcitonin: 2.44 ng/mL

## 2019-03-26 LAB — LACTIC ACID, PLASMA: Lactic Acid, Venous: 0.8 mmol/L (ref 0.5–1.9)

## 2019-03-26 LAB — MAGNESIUM: Magnesium: 1.9 mg/dL (ref 1.7–2.4)

## 2019-03-26 MED ORDER — LORAZEPAM 0.5 MG PO TABS: 0.5000 mg | ORAL_TABLET | Freq: Three times a day (TID) | ORAL | 0 refills | Status: AC | PRN

## 2019-03-26 MED ORDER — POTASSIUM CHLORIDE IN NACL 20-0.45 MEQ/L-% IV SOLN: INTRAVENOUS | Status: DC

## 2019-03-26 MED ORDER — MORPHINE SULFATE 20 MG/5ML PO SOLN: 2.5000 mg | ORAL | 0 refills | Status: AC | PRN

## 2019-03-26 NOTE — Care Management Important Message (Signed)
Important Message  Patient Details  Name: Ian Travis. MRN: [id number] Date of Birth: January 06, 1961   Medicare Important Message Given:  Haydee Salter to guardian at [address] [place], [place] [address])     Corey Harold 03/26/2019, 1:05 PM

## 2019-03-26 NOTE — Discharge Summary (Signed)
Physician Discharge Summary  Ian Travis. VQM:[id number] DOB: Sep 23, 1960 DOA: 03/23/2019  PCP: Galvin Proffer, MD  Admit date: 03/23/2019  Discharge date: 03/26/2019  Admitted From:SNF  Disposition:  SNF with hospice  Recommendations for Outpatient Follow-up:  1. Continue hospice care at SNF 2. Long-term prognosis is quite poor with failure to thrive  Home Health: None  Equipment/Devices: None  Discharge Condition: Stable  CODE STATUS: DNR  Diet recommendation: Dysphagia 1  Brief/Interim Summary: Per HPI: Ian Travisis a 59 y.o.malewho is a longterm care resident at [facility] was sent to ED by EMS from facility with reports that he has been increasingly somnolent and his behavior has become progressively nonresponsive. 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 He is a ward of the state and has a legal guardian. I spoke with his legal guardian and she tells me that patient was treated for Covid pneumonia in [place] in early January 2021. After recovery he has been at [facility]. He is on a pureed diet and requires assistance with feeding at baseline. He is DNR/DNI. He has history of chronic malnutrition and hypotension. He has history of generalized seizures and is taking antiepileptics. He is bedbound and has not been ambulatory in many years. EMS found him hypoxic to 75% on RA and placed him on NRB and transported him to ED.  2/24: Patient was admitted with severe sepsis secondary to presumed nosocomial pneumonia with left-sided infiltrate noted. He was started on vancomycin, cefepime and Flagyl with vancomycin discontinued today. He continues to have lactic acidosis for which IV fluid will be increased. Unknown baseline level of functioning. Will discuss with guardian. He will require ongoing treatment for now with plans to discharge back  to [facility] once improved.We will plan to consult palliative care today as he appears to have some failure to thrive and severe dementia with poor prognosis of survival long-term.  2/25: Patient continues to remain on IV fluid as well as cefepime and Flagyl with improvement in lactic acidosis as well as leukocytosis this morning.  Unfortunately, he still appears to be struggling from a clinical standpoint.  He seems appropriate for transfer to telemetry today which will be arranged.  Hopefully he can go back to [facility] on hospice care by a.m if not further improved.  2/26: Clinically, patient still has not had very much improvement in his condition despite treatment with antibiotics and IV fluids.  He appears to be failing to thrive with little to no oral intake and severe issues with dysphagia after SLP evaluation.  It appears that he has very poor long-term prognosis and has been seen by palliative care with similar assessment.  His guardian has been notified of the need for hospice care on discharge back to his facility.  He will be prescribed morphine and Ativan as needed for comfort with palliative services initiated.  No further need for antibiotics as this will not change his outcome.  Discharge Diagnoses:  Principal Problem:   Severe sepsis (HCC) Active Problems:   Leukocytosis   DNR (do not resuscitate)   Dementia with Lewy bodies (HCC)   Cognitive communication deficit   HCAP (healthcare-associated pneumonia)   Hypotension, unspecified   Aphasia-angular gyrus syndrome   Cannot walk   Parkinsons disease (HCC)   CN (constipation)   Generalized-onset seizures (HCC)   AKI (acute kidney injury) (HCC)  Severe dehydration   Sinus tachycardia   History of COVID-19   Lactic acidosis   Palliative care by specialist   Goals of care, counseling/discussion  Principal discharge diagnosis: Severe sepsis secondary to nosocomial pneumonia in the setting of failure to  thrive.  Discharge Instructions  Discharge Instructions    Diet - low sodium heart healthy   Complete by: As directed    Increase activity slowly   Complete by: As directed      Allergies as of 03/26/2019   No Known Allergies     Medication List    TAKE these medications   fludrocortisone 0.1 MG tablet Commonly known as: FLORINEF Take 0.1 mg by mouth daily.   LORazepam 0.5 MG tablet Commonly known as: Ativan Take 1 tablet (0.5 mg total) by mouth every 8 (eight) hours as needed for anxiety.   midodrine 10 MG tablet Commonly known as: PROAMATINE Take 10 mg by mouth 3 (three) times daily.   morphine 20 MG/5ML solution Take 0.6 mLs (2.4 mg total) by mouth every 2 (two) hours as needed for pain.   risperiDONE 0.5 MG tablet Commonly known as: RISPERDAL Take 0.5 mg by mouth 2 (two) times daily.   sertraline 100 MG tablet Commonly known as: ZOLOFT Take 100 mg by mouth daily.   Valproate Sodium 250 MG/5ML Soln solution Commonly known as: DEPAKENE Take 500 mg by mouth 2 (two) times daily.      Follow-up Information    Hague, Myrene GalasImran P, MD Follow up in 1 week(s).   Specialty: Internal Medicine Contact information: [address] [place] [id number] [phone]          No Known Allergies  Consultations:  Palliative care   Procedures/Studies: CT Head Wo Contrast  Result Date: 03/08/2019 CLINICAL DATA:  Ataxia, stroke suspected. Additional provided: Altered mental status, weakness, patient nonverbal at baseline but has not been eating for the past 3-4 days EXAM: CT HEAD WITHOUT CONTRAST TECHNIQUE: Contiguous axial images were obtained from the base of the skull through the vertex without intravenous contrast. COMPARISON:  Brain MRI 11/15/2015 FINDINGS: Brain: Streak artifact from dental restoration somewhat limits evaluation of the posterior fossa. No evidence of acute intracranial hemorrhage. No demarcated cortical infarction. No evidence of intracranial  mass. No midline shift or extra-axial fluid collection. Moderate generalized cerebral atrophy has significantly progressed since MRI 11/19/2015 with associated interval increase in lateral and third ventriculomegaly. Vascular: No hyperdense vessel. Skull: Normal. Negative for fracture or focal lesion. Sinuses/Orbits: Visualized orbits demonstrate no acute abnormality. Mild ethmoid sinus mucosal thickening. Small amount of frothy secretions within posterior right ethmoid air cells. Small bilateral maxillary sinus mucous retention cysts. No significant mastoid effusion. IMPRESSION: Streak artifact from dental restoration somewhat limits evaluation of the posterior fossa. Within this limitation, no evidence of acute intracranial abnormality. Moderate generalized cerebral atrophy has significantly progressed since MRI 11/19/2015. Paranasal sinus disease as described. This includes frothy secretions within posterior right ethmoid air cells. Correlate for acute on chronic sinusitis. Electronically Signed   By: Jackey LogeKyle  Golden DO   On: 03/08/2019 21:59   DG Chest Port 1 View  Result Date: 03/23/2019 CLINICAL DATA:  Altered mental status.  Cough.  Fever. EXAM: PORTABLE CHEST 1 VIEW COMPARISON:  No prior. FINDINGS: Mediastinum and hilar structures normal. Heart size normal. Left base infiltrate. Small left pleural effusion. No pneumothorax. IMPRESSION: Left base infiltrate suggesting pneumonia. Associated small left pleural effusion. Electronically Signed   By: Maisie Fushomas  Register   On: 03/23/2019 11:20  Discharge Exam: Vitals:   03/25/19 2205 03/26/19 0529  BP: 127/72 114/74  Pulse: 82 72  Resp: 18 20  Temp: 98.9 F (37.2 C) 98.2 F (36.8 C)  SpO2: 100% 99%   Vitals:   03/25/19 1840 03/25/19 2044 03/25/19 2205 03/26/19 0529  BP: 108/65  127/72 114/74  Pulse: 78  82 72  Resp: 18  18 20   Temp: 98.1 F (36.7 C)  98.9 F (37.2 C) 98.2 F (36.8 C)  TempSrc: Axillary   Oral  SpO2: 99% 98% 100% 99%   Weight: 54.2 kg     Height: 6\' 1"  (1.854 m)       General: Pt is generally unresponsive Cardiovascular: RRR, S1/S2 +, no rubs, no gallops Respiratory: CTA bilaterally, no wheezing, no rhonchi, on 3 L nasal cannula oxygen Abdominal: Soft, NT, ND, bowel sounds + Extremities: no edema, no cyanosis    The results of significant diagnostics from this hospitalization (including imaging, microbiology, ancillary and laboratory) are listed below for reference.     Microbiology: Recent Results (from the past 240 hour(s))  Blood Culture (routine x 2)     Status: None (Preliminary result)   Collection Time: 03/23/19 10:57 AM   Specimen: BLOOD RIGHT FOREARM  Result Value Ref Range Status   Specimen Description BLOOD RIGHT FOREARM DRAWN BY RN [phone]  Final   Special Requests   Final    Blood Culture results may not be optimal due to an inadequate volume of blood received in culture bottles   Culture   Final    NO GROWTH 3 DAYS Performed at [facility], [address], [place], [address] [place]    Report Status PENDING  Incomplete  Urine culture     Status: None   Collection Time: 03/23/19 10:57 AM   Specimen: In/Out Cath Urine  Result Value Ref Range Status   Specimen Description   Final    IN/OUT CATH URINE Performed at [facility], [address], [place], [address] [place]    Special Requests   Final    NONE Performed at [facility], [address] [place], [address] [place]    Culture   Final    NO GROWTH Performed at [facility], [address], [place], [address] [place]    Report Status 03/24/2019 FINAL  Final  Blood Culture (routine x 2)     Status: None (Preliminary result)   Collection Time: 03/23/19 11:29 AM   Specimen: BLOOD  Result Value Ref Range Status   Specimen Description BLOOD  Final   Special Requests NONE  Final   Culture   Final    NO GROWTH 3 DAYS Performed at [facility], [address] [place], [address] [place]    Report Status PENDING   Incomplete  Respiratory Panel by RT PCR (Flu A&B, Covid) - Nasopharyngeal Swab     Status: None   Collection Time: 03/23/19  2:45 PM   Specimen: Nasopharyngeal Swab  Result Value Ref Range Status   SARS Coronavirus 2 by RT PCR NEGATIVE NEGATIVE Final    Comment: (NOTE) SARS-CoV-2 target nucleic acids are NOT DETECTED. The SARS-CoV-2 RNA is generally detectable in upper respiratoy specimens during the acute phase of infection. The lowest concentration of SARS-CoV-2 viral copies this assay can detect is 131 copies/mL. A negative result does not preclude SARS-Cov-2 infection and should not be used as the sole basis for treatment or other patient management decisions. A negative result may occur with  improper  specimen collection/handling, submission of specimen other than nasopharyngeal swab, presence of viral mutation(s) within the areas targeted by this assay, and inadequate number of viral copies (<131 copies/mL). A negative result must be combined with clinical observations, patient history, and epidemiological information. The expected result is Negative. Fact Sheet for Patients:  https://www.moore.com/ Fact Sheet for Healthcare Providers:  https://www.young.biz/ This test is not yet ap proved or cleared by the [place] FDA and  has been authorized for detection and/or diagnosis of SARS-CoV-2 by FDA under an Emergency Use Authorization (EUA). This EUA will remain  in effect (meaning this test can be used) for the duration of the COVID-19 declaration under Section 564(b)(1) of the Act, 21 U.S.C. section 360bbb-3(b)(1), unless the authorization is terminated or revoked sooner.    Influenza A by PCR NEGATIVE NEGATIVE Final   Influenza B by PCR NEGATIVE NEGATIVE Final    Comment: (NOTE) The Xpert Xpress SARS-CoV-2/FLU/RSV assay is intended as an aid in  the diagnosis of influenza from Nasopharyngeal swab specimens and  should not be used  as a sole basis for treatment. Nasal washings and  aspirates are unacceptable for Xpert Xpress SARS-CoV-2/FLU/RSV  testing. Fact Sheet for Patients: https://www.moore.com/ Fact Sheet for Healthcare Providers: https://www.young.biz/ This test is not yet approved or cleared by the [place] FDA and  has been authorized for detection and/or diagnosis of SARS-CoV-2 by  FDA under an Emergency Use Authorization (EUA). This EUA will remain  in effect (meaning this test can be used) for the duration of the  Covid-19 declaration under Section 564(b)(1) of the Act, 21  U.S.C. section 360bbb-3(b)(1), unless the authorization is  terminated or revoked. Performed at [facility], [address], [place], [place] [address]   MRSA PCR Screening     Status: None   Collection Time: 03/23/19  6:29 PM   Specimen: Nasal Mucosa; Nasopharyngeal  Result Value Ref Range Status   MRSA by PCR NEGATIVE NEGATIVE Final    Comment:        The GeneXpert MRSA Assay (FDA approved for NASAL specimens only), is one component of a comprehensive MRSA colonization surveillance program. It is not intended to diagnose MRSA infection nor to guide or monitor treatment for MRSA infections. Performed at [facility], [address], [place], [place] [address]      Labs: BNP (last 3 results) No results for input(s): BNP in the last 8760 hours. Basic Metabolic Panel: Recent Labs  Lab 03/23/19 1129 03/24/19 0411 03/25/19 0500 03/26/19 0528  NA 145 145 148* 152*  K 4.9 4.4 3.3* 3.5  CL 109 111 114* 115*  CO2 22 25 26 27   GLUCOSE 104* 111* 102* 97  BUN 39* 30* 24* 23*  CREATININE 1.74* 1.09 0.86 0.82  CALCIUM 8.0* 8.3* 7.9* 7.9*  MG  --  2.1 1.9 1.9   Liver Function Tests: Recent Labs  Lab 03/23/19 1129 03/24/19 0411 03/25/19 0500 03/26/19 0528  AST 44* 51* 82* 61*  ALT 36 37 54* 48*  ALKPHOS 35* 34* 32* 32*  BILITOT 0.8 0.4 0.5 0.6  PROT 6.2* 6.2* 5.4* 5.0*   ALBUMIN 2.4* 2.2* 1.9* 1.7*   No results for input(s): LIPASE, AMYLASE in the last 168 hours. No results for input(s): AMMONIA in the last 168 hours. CBC: Recent Labs  Lab 03/23/19 1129 03/24/19 0411 03/25/19 0500 03/26/19 0528  WBC 15.3* 12.7* 11.6* 11.2*  NEUTROABS 12.6* 10.3* 10.3* 9.7*  HGB 11.2* 9.2* 7.8* 7.9*  HCT 38.1* 32.1* 25.4* 26.9*  MCV 103.5* 106.3* 99.2 101.1*  PLT 310 265 324 320   Cardiac Enzymes: No results for input(s): CKTOTAL, CKMB, CKMBINDEX, TROPONINI in the last 168 hours. BNP: Invalid input(s): POCBNP CBG: No results for input(s): GLUCAP in the last 168 hours. D-Dimer No results for input(s): DDIMER in the last 72 hours. Hgb A1c No results for input(s): HGBA1C in the last 72 hours. Lipid Profile No results for input(s): CHOL, HDL, LDLCALC, TRIG, CHOLHDL, LDLDIRECT in the last 72 hours. Thyroid function studies No results for input(s): TSH, T4TOTAL, T3FREE, THYROIDAB in the last 72 hours.  Invalid input(s): FREET3 Anemia work up No results for input(s): VITAMINB12, FOLATE, FERRITIN, TIBC, IRON, RETICCTPCT in the last 72 hours. Urinalysis    Component Value Date/Time   COLORURINE YELLOW 03/23/2019 1057   APPEARANCEUR CLEAR 03/23/2019 1057   LABSPEC 1.019 03/23/2019 1057   PHURINE 6.0 03/23/2019 1057   GLUCOSEU NEGATIVE 03/23/2019 1057   HGBUR NEGATIVE 03/23/2019 1057   BILIRUBINUR NEGATIVE 03/23/2019 1057   KETONESUR NEGATIVE 03/23/2019 1057   PROTEINUR NEGATIVE 03/23/2019 1057   NITRITE NEGATIVE 03/23/2019 1057   LEUKOCYTESUR NEGATIVE 03/23/2019 1057   Sepsis Labs Invalid input(s): PROCALCITONIN,  WBC,  LACTICIDVEN Microbiology Recent Results (from the past 240 hour(s))  Blood Culture (routine x 2)     Status: None (Preliminary result)   Collection Time: 03/23/19 10:57 AM   Specimen: BLOOD RIGHT FOREARM  Result Value Ref Range Status   Specimen Description BLOOD RIGHT FOREARM DRAWN BY RN [phone]  Final   Special Requests   Final     Blood Culture results may not be optimal due to an inadequate volume of blood received in culture bottles   Culture   Final    NO GROWTH 3 DAYS Performed at [facility], [address] [place], [place] [address]    Report Status PENDING  Incomplete  Urine culture     Status: None   Collection Time: 03/23/19 10:57 AM   Specimen: In/Out Cath Urine  Result Value Ref Range Status   Specimen Description   Final    IN/OUT CATH URINE Performed at [facility], [address], [place], [place] [address]    Special Requests   Final    NONE Performed at [facility], [address], [place], [place] [address]    Culture   Final    NO GROWTH Performed at [facility], [place] [address] [place], [place] [address]    Report Status 03/24/2019 FINAL  Final  Blood Culture (routine x 2)     Status: None (Preliminary result)   Collection Time: 03/23/19 11:29 AM   Specimen: BLOOD  Result Value Ref Range Status   Specimen Description BLOOD  Final   Special Requests NONE  Final   Culture   Final    NO GROWTH 3 DAYS Performed at [facility], [address] [place], [place] [address]    Report Status PENDING  Incomplete  Respiratory Panel by RT PCR (Flu A&B, Covid) - Nasopharyngeal Swab     Status: None   Collection Time: 03/23/19  2:45 PM   Specimen: Nasopharyngeal Swab  Result Value Ref Range Status   SARS Coronavirus 2 by RT PCR NEGATIVE NEGATIVE Final    Comment: (NOTE) SARS-CoV-2 target nucleic acids are NOT DETECTED. The SARS-CoV-2 RNA is generally detectable in upper respiratoy specimens during the acute phase of infection. The lowest concentration of SARS-CoV-2 viral copies this assay can detect is 131 copies/mL. A negative result does  not preclude SARS-Cov-2 infection and should not be used as the sole basis for treatment or other patient management decisions. A negative result may occur with  improper specimen collection/handling, submission of specimen other than  nasopharyngeal swab, presence of viral mutation(s) within the areas targeted by this assay, and inadequate number of viral copies (<131 copies/mL). A negative result must be combined with clinical observations, patient history, and epidemiological information. The expected result is Negative. Fact Sheet for Patients:  https://www.moore.com/ Fact Sheet for Healthcare Providers:  https://www.young.biz/ This test is not yet ap proved or cleared by the [place] FDA and  has been authorized for detection and/or diagnosis of SARS-CoV-2 by FDA under an Emergency Use Authorization (EUA). This EUA will remain  in effect (meaning this test can be used) for the duration of the COVID-19 declaration under Section 564(b)(1) of the Act, 21 U.S.C. section 360bbb-3(b)(1), unless the authorization is terminated or revoked sooner.    Influenza A by PCR NEGATIVE NEGATIVE Final   Influenza B by PCR NEGATIVE NEGATIVE Final    Comment: (NOTE) The Xpert Xpress SARS-CoV-2/FLU/RSV assay is intended as an aid in  the diagnosis of influenza from Nasopharyngeal swab specimens and  should not be used as a sole basis for treatment. Nasal washings and  aspirates are unacceptable for Xpert Xpress SARS-CoV-2/FLU/RSV  testing. Fact Sheet for Patients: https://www.moore.com/ Fact Sheet for Healthcare Providers: https://www.young.biz/ This test is not yet approved or cleared by the [place] FDA and  has been authorized for detection and/or diagnosis of SARS-CoV-2 by  FDA under an Emergency Use Authorization (EUA). This EUA will remain  in effect (meaning this test can be used) for the duration of the  Covid-19 declaration under Section 564(b)(1) of the Act, 21  U.S.C. section 360bbb-3(b)(1), unless the authorization is  terminated or revoked. Performed at [facility], [address] [place], [place] [address]   MRSA PCR Screening      Status: None   Collection Time: 03/23/19  6:29 PM   Specimen: Nasal Mucosa; Nasopharyngeal  Result Value Ref Range Status   MRSA by PCR NEGATIVE NEGATIVE Final    Comment:        The GeneXpert MRSA Assay (FDA approved for NASAL specimens only), is one component of a comprehensive MRSA colonization surveillance program. It is not intended to diagnose MRSA infection nor to guide or monitor treatment for MRSA infections. Performed at [facility], [address], [place], [place] [address]      Time coordinating discharge: 35 minutes  SIGNED:   Erick Blinks, DO [facility] 03/26/2019, 10:57 AM  If 7PM-7AM, please contact night-coverage [email or web address]

## 2019-03-26 NOTE — TOC Transition Note (Addendum)
Transition of Care [facility]) - CM/SW Discharge Note   Patient Details  Name: Ian Travis. MRN: [id number] Date of Birth: 02-Aug-1960  Transition of Care [facility]) CM/SW Contact:  Thinh Cuccaro, Chrystine Oiler, RN Phone Number: 03/26/2019, 12:42 PM   Clinical Narrative:   Patient transferring back to [facility] with hospice services provided by [facility] hospice. DC clinicals sent. Bedside RN to call report.    ADDENDUM: EMS called.  Minerva Areola of [employer].   Final next level of care: Skilled Nursing Facility(back to SNF with hospice) Barriers to Discharge: Barriers Resolved  Discharge Placement              Patient chooses bed at: [facility]) Patient to be transferred to facility by: [facility] Name of family member notified: gaurdian Patient and family notified of of transfer: 03/26/19       Social Determinants of Health (SDOH) Interventions     Readmission Risk Interventions No flowsheet data found.

## 2019-03-29 LAB — CULTURE, BLOOD (ROUTINE X 2)
Culture: NO GROWTH
Culture: NO GROWTH

## 2019-04-29 DEATH — deceased

## 2020-09-10 IMAGING — CT CT HEAD W/O CM
4 of 5 series · 15 of 47 positions shown, 16 images · non-contrast
Comparison: Brain MRI 11/15/2015

CLINICAL DATA: Ataxia, stroke suspected. Additional provided:
Altered mental status, weakness, patient nonverbal at baseline but
has not been eating for the past 3-4 days

EXAM:
CT HEAD WITHOUT CONTRAST
TECHNIQUE: Contiguous axial images were obtained from the base of the skull
through the vertex without intravenous contrast.

[Series 2: head w o · axial · 0.49mm/px · z∈[+73,+178]mm · 4 of 36 slices shown, 5 images]
[im 8/36  brain]
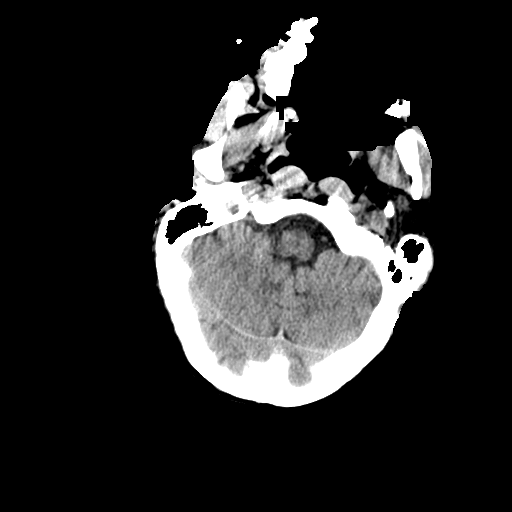
[im 8/36  bone]
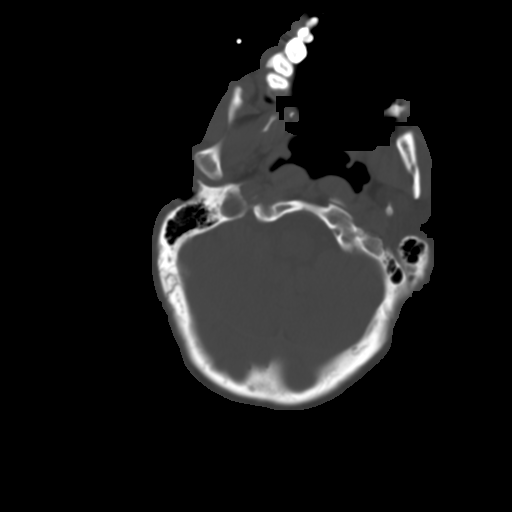
[im 15/36  brain]
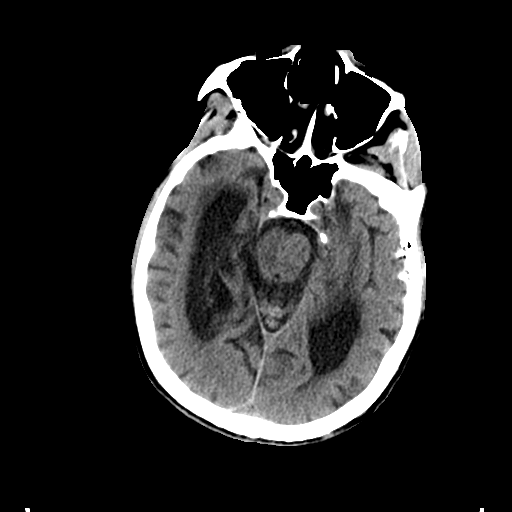
[im 22/36  brain]
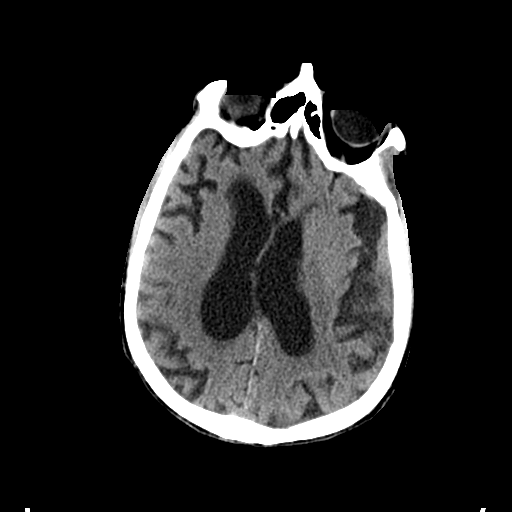
[im 29/36  brain]
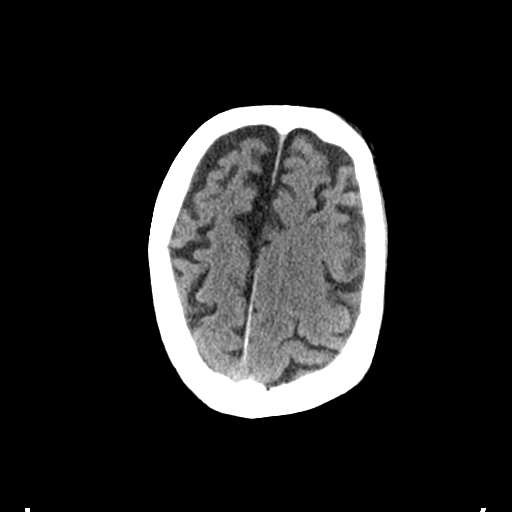

[Series 4: coronal soft · coronal · 0.32mm/px · 3 of 84 slices shown]
[im 30/84  brain]
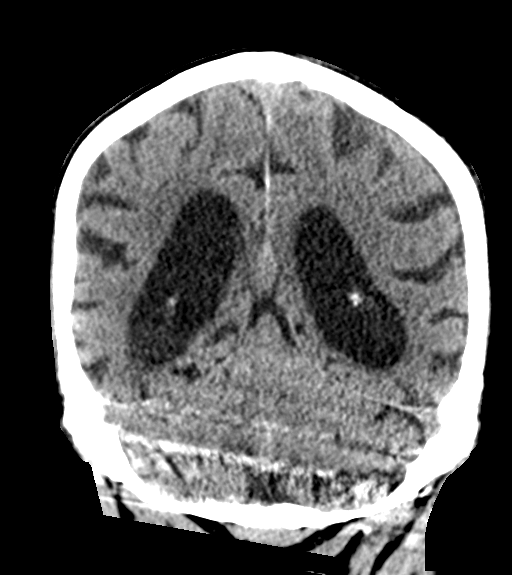
[im 38/84  brain]
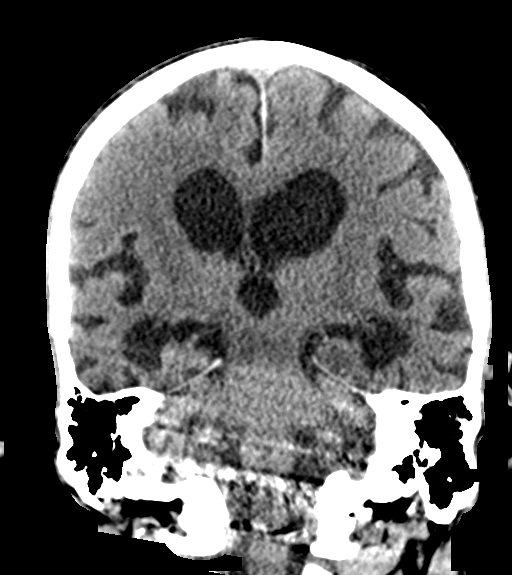
[im 46/84  brain]
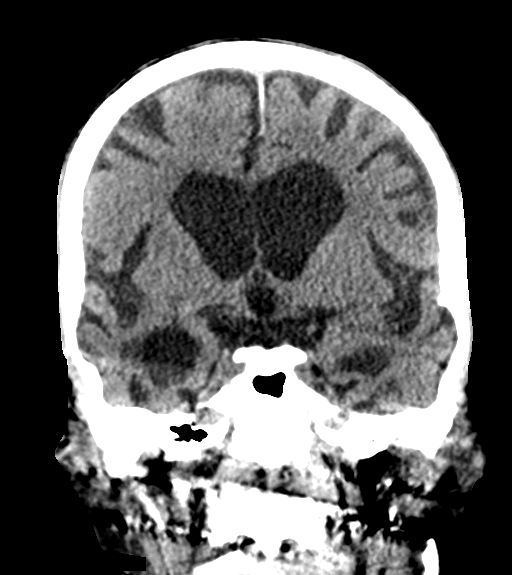

[Series 5: head ax w o · axial · 0.32mm/px · z∈[+117,+211]mm · 5 of 35 slices shown]
[im 6/35  brain]
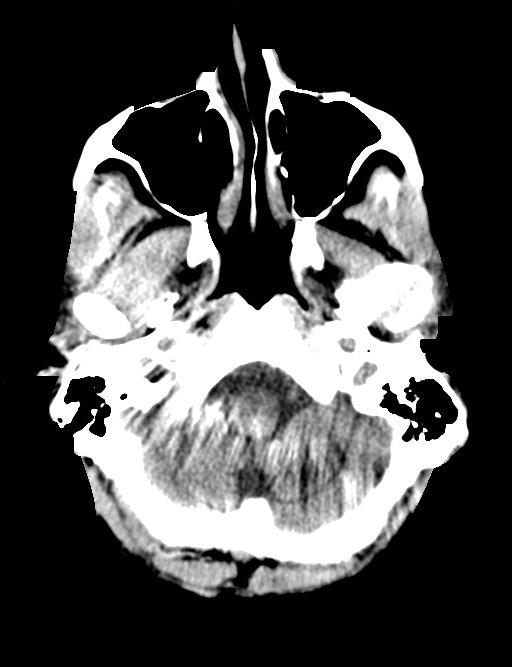
[im 12/35  brain]
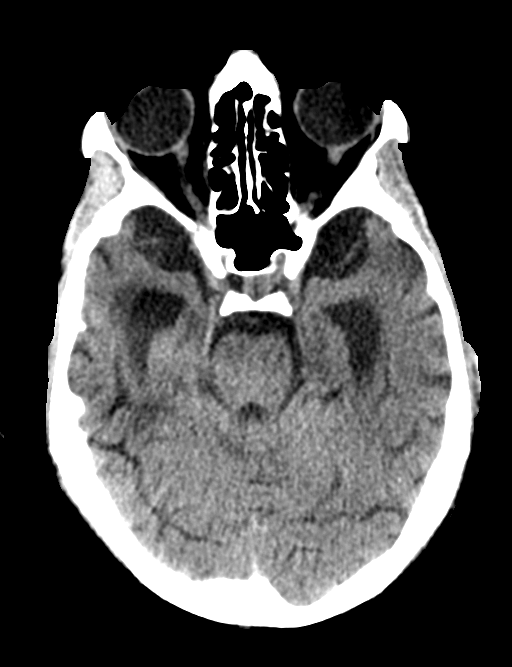
[im 18/35  brain]
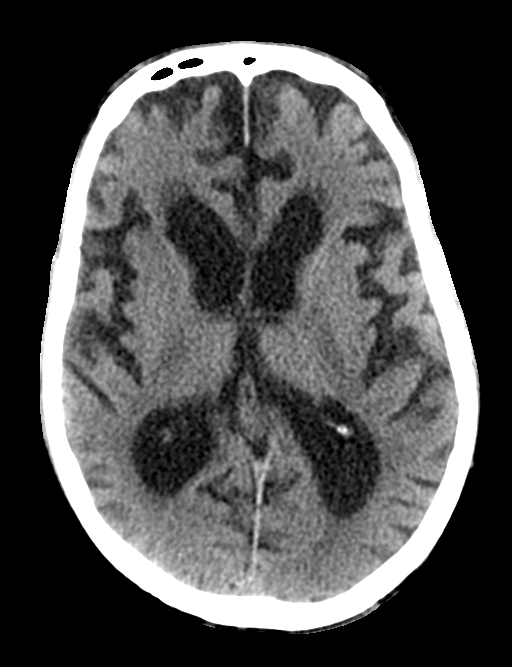
[im 23/35  brain]
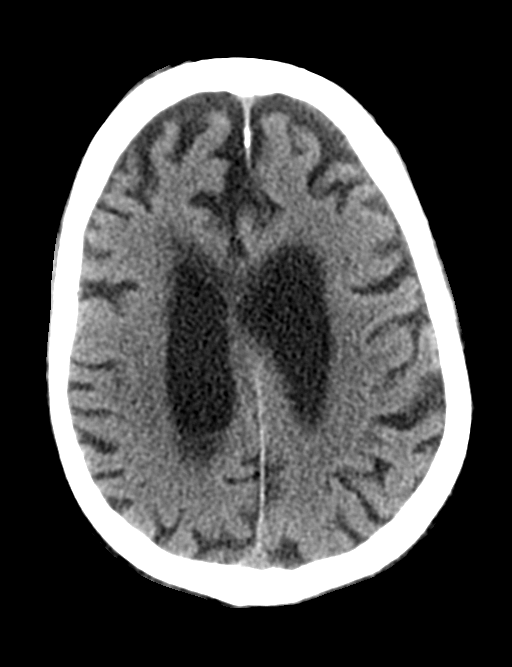
[im 29/35  brain]
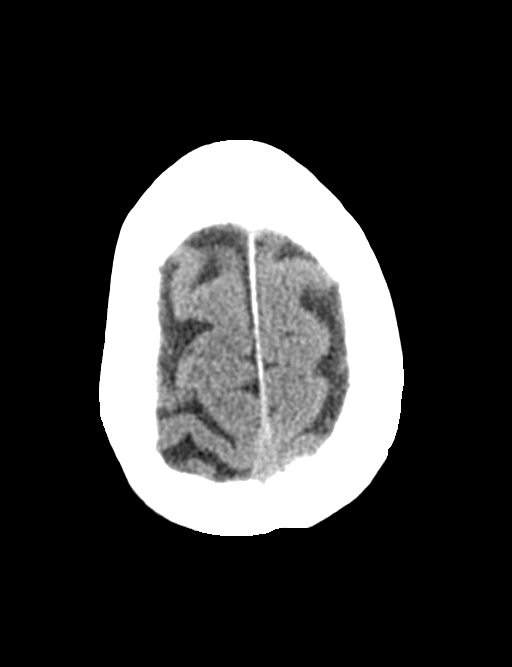

[Series 7: sagittal soft · sagittal · 0.33mm/px · 3 of 54 slices shown]
[im 21/54  brain]
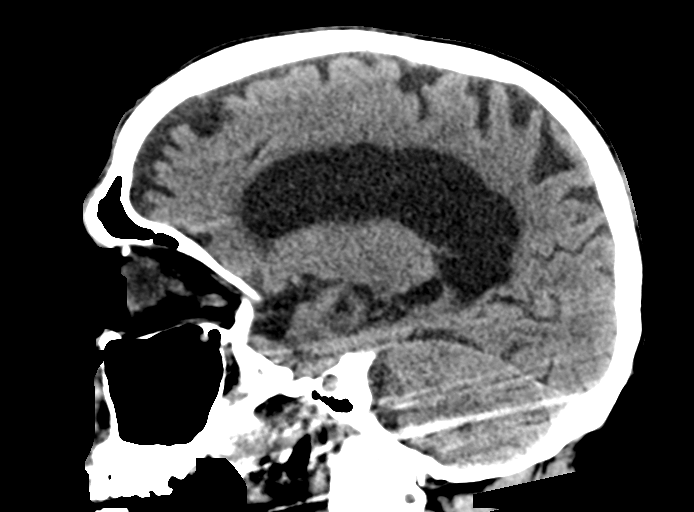
[im 27/54  brain]
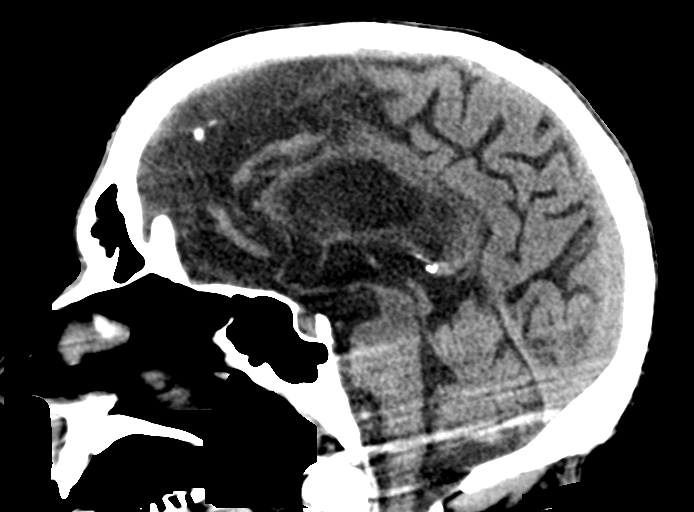
[im 34/54  brain]
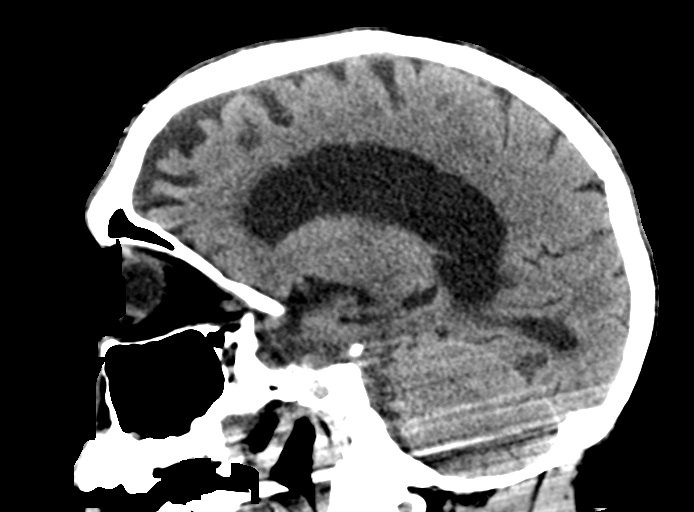

[15 of 47 positions shown; findings below may reference images not displayed]

FINDINGS: Brain:

Streak artifact from dental restoration somewhat limits evaluation
of the posterior fossa.

No evidence of acute intracranial hemorrhage.

No demarcated cortical infarction.

No evidence of intracranial mass.

No midline shift or extra-axial fluid collection.

Moderate generalized cerebral atrophy has significantly progressed
since MRI 11/19/2015 with associated interval increase in lateral
and third ventriculomegaly.

Vascular: No hyperdense vessel.

Skull: Normal. Negative for fracture or focal lesion.

Sinuses/Orbits: Visualized orbits demonstrate no acute abnormality.
Mild ethmoid sinus mucosal thickening. Small amount of frothy
secretions within posterior right ethmoid air cells. Small bilateral
maxillary sinus mucous retention cysts. No significant mastoid
effusion.
IMPRESSION: Streak artifact from dental restoration somewhat limits evaluation
of the posterior fossa.

Within this limitation, no evidence of acute intracranial
abnormality.

Moderate generalized cerebral atrophy has significantly progressed
since MRI 11/19/2015.

Paranasal sinus disease as described. This includes frothy
secretions within posterior right ethmoid air cells. Correlate for
acute on chronic sinusitis.
